# Patient Record
Sex: Female | Born: 1970 | Hispanic: Yes | Marital: Married | Smoking: Never smoker
Health system: Southern US, Community
[De-identification: ages and names within clinical notes are randomized; demographics above are authoritative.]

## PROBLEM LIST (undated history)

## (undated) DIAGNOSIS — F988 Other specified behavioral and emotional disorders with onset usually occurring in childhood and adolescence: Secondary | ICD-10-CM

## (undated) DIAGNOSIS — K219 Gastro-esophageal reflux disease without esophagitis: Secondary | ICD-10-CM

## (undated) HISTORY — DX: Gastro-esophageal reflux disease without esophagitis: K21.9

## (undated) HISTORY — DX: Other specified behavioral and emotional disorders with onset usually occurring in childhood and adolescence: F98.8

## (undated) HISTORY — PX: BREAST SURGERY: SHX581

---

## 2002-03-21 ENCOUNTER — Emergency Department (HOSPITAL_COMMUNITY): Admission: EM | Admit: 2002-03-21 | Discharge: 2002-03-21 | Payer: Self-pay | Admitting: Emergency Medicine

## 2002-11-24 ENCOUNTER — Emergency Department (HOSPITAL_COMMUNITY): Admission: EM | Admit: 2002-11-24 | Discharge: 2002-11-24 | Payer: Self-pay | Admitting: Emergency Medicine

## 2003-02-09 ENCOUNTER — Emergency Department (HOSPITAL_COMMUNITY): Admission: EM | Admit: 2003-02-09 | Discharge: 2003-02-09 | Payer: Self-pay | Admitting: Emergency Medicine

## 2003-02-12 ENCOUNTER — Emergency Department (HOSPITAL_COMMUNITY): Admission: EM | Admit: 2003-02-12 | Discharge: 2003-02-12 | Payer: Self-pay | Admitting: Emergency Medicine

## 2003-12-28 ENCOUNTER — Other Ambulatory Visit: Admission: RE | Admit: 2003-12-28 | Discharge: 2003-12-28 | Payer: Self-pay | Admitting: Family Medicine

## 2004-03-02 ENCOUNTER — Emergency Department (HOSPITAL_COMMUNITY): Admission: EM | Admit: 2004-03-02 | Discharge: 2004-03-02 | Payer: Self-pay | Admitting: Emergency Medicine

## 2004-12-29 ENCOUNTER — Other Ambulatory Visit: Admission: RE | Admit: 2004-12-29 | Discharge: 2004-12-29 | Payer: Self-pay | Admitting: Family Medicine

## 2006-02-02 ENCOUNTER — Other Ambulatory Visit: Admission: RE | Admit: 2006-02-02 | Discharge: 2006-02-02 | Payer: Self-pay | Admitting: Family Medicine

## 2010-02-18 ENCOUNTER — Other Ambulatory Visit (HOSPITAL_COMMUNITY)
Admission: RE | Admit: 2010-02-18 | Discharge: 2010-02-18 | Disposition: A | Discharge status: Home / Self Care | Payer: Medicaid Other | Source: Ambulatory Visit | Attending: Family Medicine | Admitting: Family Medicine

## 2010-02-18 ENCOUNTER — Other Ambulatory Visit: Payer: Self-pay | Admitting: Family Medicine

## 2010-02-18 DIAGNOSIS — Z1159 Encounter for screening for other viral diseases: Secondary | ICD-10-CM | POA: Insufficient documentation

## 2010-02-18 DIAGNOSIS — Z124 Encounter for screening for malignant neoplasm of cervix: Secondary | ICD-10-CM | POA: Insufficient documentation

## 2010-02-18 DIAGNOSIS — Z113 Encounter for screening for infections with a predominantly sexual mode of transmission: Secondary | ICD-10-CM | POA: Insufficient documentation

## 2010-02-21 ENCOUNTER — Other Ambulatory Visit: Payer: Self-pay | Admitting: Family Medicine

## 2010-02-21 DIAGNOSIS — Z1231 Encounter for screening mammogram for malignant neoplasm of breast: Secondary | ICD-10-CM

## 2010-03-04 ENCOUNTER — Ambulatory Visit
Admission: RE | Admit: 2010-03-04 | Discharge: 2010-03-04 | Disposition: A | Discharge status: Home / Self Care | Payer: Medicaid Other | Source: Ambulatory Visit | Attending: Family Medicine | Admitting: Family Medicine

## 2010-03-04 DIAGNOSIS — Z1231 Encounter for screening mammogram for malignant neoplasm of breast: Secondary | ICD-10-CM

## 2010-03-08 ENCOUNTER — Other Ambulatory Visit: Payer: Self-pay | Admitting: Family Medicine

## 2010-03-08 DIAGNOSIS — R928 Other abnormal and inconclusive findings on diagnostic imaging of breast: Secondary | ICD-10-CM

## 2010-03-14 ENCOUNTER — Ambulatory Visit
Admission: RE | Admit: 2010-03-14 | Discharge: 2010-03-14 | Disposition: A | Discharge status: Home / Self Care | Payer: Medicaid Other | Source: Ambulatory Visit | Attending: Family Medicine | Admitting: Family Medicine

## 2010-03-14 DIAGNOSIS — R928 Other abnormal and inconclusive findings on diagnostic imaging of breast: Secondary | ICD-10-CM

## 2011-07-10 ENCOUNTER — Encounter: Payer: Self-pay | Admitting: *Deleted

## 2011-07-10 ENCOUNTER — Emergency Department
Admission: EM | Admit: 2011-07-10 | Discharge: 2011-07-10 | Disposition: A | Discharge status: Home / Self Care | Payer: Medicaid Other | Source: Home / Self Care

## 2011-07-10 DIAGNOSIS — N390 Urinary tract infection, site not specified: Secondary | ICD-10-CM

## 2011-07-10 DIAGNOSIS — R300 Dysuria: Secondary | ICD-10-CM

## 2011-07-10 LAB — POCT URINALYSIS DIP (MANUAL ENTRY)
Glucose, UA: NEGATIVE
Nitrite, UA: NEGATIVE
Spec Grav, UA: 1.025 (ref 1.005–1.03)
Urobilinogen, UA: 0.2 (ref 0–1)

## 2011-07-10 MED ORDER — CEPHALEXIN 500 MG PO CAPS: 500.0000 mg | ORAL_CAPSULE | Freq: Three times a day (TID) | ORAL | Status: AC

## 2011-07-10 NOTE — Discharge Instructions (Signed)

## 2011-07-10 NOTE — ED Provider Notes (Signed)
History     CSN: [id number]  Arrival date & time 07/10/11  1205   First MD Initiated Contact with Patient 07/10/11 1216      No chief complaint on file. HPI Comments: DYSURIA Onset:  2 days  Description: dysuria, increased urinary frequency  Modifying factors: none   Symptoms Urgency:  yes Frequency: yes  Hesitancy:  no Hematuria:  mild Flank Pain:  mild Fever: no Nausea/Vomiting:  no Missed LMP: no STD exposure: no Discharge: no Irritants: no Rash: no  Red Flags   More than 3 UTI's last 12 months:  no PMH of  Diabetes or Immunosuppression:  no Renal Disease/Calculi: no Urinary Tract Abnormality:  no Instrumentation or Trauma: no      No past medical history on file.  No past surgical history on file.  No family history on file.  History  Substance Use Topics  . Smoking status: Not on file  . Smokeless tobacco: Not on file  . Alcohol Use: Not on file    OB History    No data available      Review of Systems  All other systems reviewed and are negative.    Allergies  Review of patient's allergies indicates not on file.  Home Medications  No current outpatient prescriptions on file.  There were no vitals taken for this visit.  Physical Exam  Constitutional: She appears well-developed and well-nourished.  HENT:  Head: Normocephalic and atraumatic.  Eyes: Conjunctivae are normal. Pupils are equal, round, and reactive to light.  Neck: Normal range of motion. Neck supple.  Cardiovascular: Normal rate and regular rhythm.   Pulmonary/Chest: Effort normal and breath sounds normal.  Abdominal: Soft.       + mild flank pain  + mild suprapubic pain   Musculoskeletal: Normal range of motion.  Neurological: She is alert.  Skin: Skin is warm.    ED Course  Procedures (including critical care time)  Labs Reviewed - No data to display No results found.   No diagnosis found.    MDM  Exam clinically consistent with UTI.  Will treat with  keflex Urine culture.  Infectious red flags discussed.  Handout given  Follow up as needed.     The patient and/or caregiver has been counseled thoroughly with regard to treatment plan and/or medications prescribed including dosage, schedule, interactions, rationale for use, and possible side effects and they verbalize understanding. Diagnoses and expected course of recovery discussed and will return if not improved as expected or if the condition worsens. Patient and/or caregiver verbalized understanding.             Floydene Flock, MD 07/10/11 [phone]

## 2011-07-10 NOTE — ED Notes (Signed)
Patient c/o dysuria, urinary frequency, flank pain and hematuria x this AM. She has not taken any OTC meds.

## 2011-07-12 NOTE — ED Provider Notes (Signed)
Pt seen by Dr. Doroteo Bradford, MD 07/12/11 [phone]

## 2011-07-13 ENCOUNTER — Telehealth: Payer: Self-pay | Admitting: *Deleted

## 2011-08-07 ENCOUNTER — Encounter: Payer: Self-pay | Admitting: Physician Assistant

## 2011-08-07 ENCOUNTER — Other Ambulatory Visit (HOSPITAL_COMMUNITY)
Admission: RE | Admit: 2011-08-07 | Discharge: 2011-08-07 | Disposition: A | Discharge status: Home / Self Care | Payer: BC Managed Care – PPO | Source: Ambulatory Visit | Attending: Family Medicine | Admitting: Family Medicine

## 2011-08-07 ENCOUNTER — Ambulatory Visit (INDEPENDENT_AMBULATORY_CARE_PROVIDER_SITE_OTHER): Payer: BC Managed Care – PPO | Admitting: Physician Assistant

## 2011-08-07 VITALS — BP 99/64 | HR 71 | Ht 61.0 in | Wt 113.0 lb

## 2011-08-07 DIAGNOSIS — F3281 Premenstrual dysphoric disorder: Secondary | ICD-10-CM

## 2011-08-07 DIAGNOSIS — Z131 Encounter for screening for diabetes mellitus: Secondary | ICD-10-CM

## 2011-08-07 DIAGNOSIS — Z01419 Encounter for gynecological examination (general) (routine) without abnormal findings: Secondary | ICD-10-CM | POA: Insufficient documentation

## 2011-08-07 DIAGNOSIS — Z13220 Encounter for screening for lipoid disorders: Secondary | ICD-10-CM

## 2011-08-07 DIAGNOSIS — N943 Premenstrual tension syndrome: Secondary | ICD-10-CM

## 2011-08-07 DIAGNOSIS — Z0000 Encounter for general adult medical examination without abnormal findings: Secondary | ICD-10-CM

## 2011-08-07 DIAGNOSIS — N898 Other specified noninflammatory disorders of vagina: Secondary | ICD-10-CM

## 2011-08-07 DIAGNOSIS — Z113 Encounter for screening for infections with a predominantly sexual mode of transmission: Secondary | ICD-10-CM | POA: Insufficient documentation

## 2011-08-07 NOTE — Patient Instructions (Addendum)
Can try B6 50 mg daily. Make sure getting Calcium 500mg  twice a day or 4 servings of daily. Exercise regularly. Try these things and see if they help your PMDD symptoms. If not we can consider medication. Will call with labs.   Premenstrual Syndrome Premenstrual syndrome (PMS) or premenstrual disphoric disorder (PMDD) is a mix of emotional and clinical symptoms. PMS occurs 10 to 14 days before the start of a menstrual period. Common symptoms include pelvic pain, headache and mood changes. Most women have PMS to some degree.  CAUSES  The cause is unknown. There is evidence that it is related to the female hormones during the second half of the menstrual cycle. These hormones fluctuate and are thought to affect chemicals in the brain (serotonin) that can influence a person's mood. SYMPTOMS  Symptoms may include any of the following:  Headache.   Swelling of hands and feet.   Abdominal bloating.   Tiredness.   Breast tenderness.   Depression.   Crying spells.   Anxiety.   Irritability.   Confusion.   Joint and muscle pains.   Forgetfulness.   Withdrawal from family, friends and activities.  DIAGNOSIS  Diagnosis is made by your caregiver who will ask you questions about the kind of symptoms you are having, when they occur and what may bring them on. If your are having any of the symptoms listed above that occur 10 to 14 days before your menstrual period, it is strong evidence you have PMS. TREATMENT   Only take over-the-counter or prescription medicines for pain, discomfort or fever as directed by your caregiver.   Oral contraceptives.   Hormone therapy.   Medications that slow down the production of serotonin in the brain (fluoxetine, sertraline and others).   Diuretics. These get rid of extra fluid from your body.   Anti-depression medication when necessary.   Surgery to remove both ovaries. This is a last resort and if no further pregnancies are wanted.   Consider  counseling or joining a PMS therapy support group.  HOME CARE INSTRUCTIONS   Exercise regularly as suggested by your caregiver. Exercise especially before your menstrual period.   Eat a regular, well-balanced diet rich in carbohydrates.   Restrict or eliminate caffeine, alcohol and tobacco consumption.   Be sure to get enough sleep. Practice relaxation techniques.   Drink 64 oz. fluids per day. This is 8 glasses, 8 oz. each. It is best to drink water.   Eliminate known stressors in your life.   Attend relationship or parenting counseling, if needed.   Take a multi-vitamin in the recommended daily dosages.   Calcium, magnesium, vitamin B6 and vitamin E are some times helpful for PMS symptoms.   Take medications as suggested by your caregiver.  SEEK MEDICAL CARE IF:   You need medication for excessive swelling, depression, severe headaches, or because you cannot sleep.   You need help from your caregiver to help you decide if you need to have your ovaries removed because none of your treatment is helping you.  Document Released: 12/24/1999 Document Revised: 12/15/2010 Document Reviewed: 03/25/2008 [facility] Patient Information 2012 [place], [place].

## 2011-08-07 NOTE — Addendum Note (Signed)
Addended by: Ellsworth Lennox on: 08/07/2011 04:42 PM   Modules accepted: Orders

## 2011-08-07 NOTE — Progress Notes (Signed)
  Subjective:     Carol Silva is a 41 y.o. female and is here for a comprehensive physical exam. The patient reports problems - around her period she gets more depressed and sad. She is fine other 2 weeks and feels great. She does not want to go on medication. she has recently started exercising regularly. She tried to eat healthy. She does consume caffeine but tries to limit it. She does not drink. .  History   Social History  . Marital Status: Legally Separated    Spouse Name: N/A    Number of Children: N/A  . Years of Education: N/A   Occupational History  . Not on file.   Social History Main Topics  . Smoking status: Never Smoker   . Smokeless tobacco: Not on file  . Alcohol Use: No  . Drug Use: No  . Sexually Active: Yes   Other Topics Concern  . Not on file   Social History Narrative  . No narrative on file   Health Maintenance  Topic Date Due  . Influenza Vaccine  10/10/2011  . Pap Smear  02/18/2013  . Tetanus/tdap  08/07/2018    The following portions of the patient's history were reviewed and updated as appropriate: allergies, current medications, past family history, past medical history, past social history, past surgical history and problem list.  Review of Systems A comprehensive review of systems was negative.   Objective:    BP 99/64  Pulse 71  Ht 5\' 1"  (1.549 m)  Wt 113 lb (51.256 kg)  BMI 21.35 kg/m2  SpO2 99%  LMP 07/31/2011 General appearance: alert, cooperative and appears stated age Head: Normocephalic, without obvious abnormality, atraumatic Eyes: conjunctivae/corneas clear. PERRL, EOM's intact. Fundi benign. Ears: normal TM's and external ear canals both ears Nose: Nares normal. Septum midline. Mucosa normal. No drainage or sinus tenderness. Throat: lips, mucosa, and tongue normal; teeth and gums normal Neck: no adenopathy, no carotid bruit, no JVD, supple, symmetrical, trachea midline and thyroid not enlarged, symmetric, no  tenderness/mass/nodules Back: symmetric, no curvature. ROM normal. No CVA tenderness. Lungs: clear to auscultation bilaterally Breasts: normal appearance, no masses or tenderness Bilateral implants Heart: regular rate and rhythm, S1, S2 normal, no murmur, click, rub or gallop Abdomen: soft, non-tender; bowel sounds normal; no masses,  no organomegaly Pelvic: cervix normal in appearance, external genitalia normal, no adnexal masses or tenderness, no cervical motion tenderness, uterus normal size, shape, and consistency and thin white discharge with no odor present around cervix Extremities: extremities normal, atraumatic, no cyanosis or edema Pulses: 2+ and symmetric Skin: Skin color, texture, turgor normal. No rashes or lesions Lymph nodes: Cervical, supraclavicular, and axillary nodes normal. Neurologic: Grossly normal    Assessment:    Healthy female exam.      Plan:    CPE/PMDD/Vaginal discharge-Will add wet prep to pap and call with results. Gave patient handout on PMDD and symptomatic treatment. Encourage her to start B6 and calcium daily. Calcium at least 4 serviing of dairy daily or 500mg  twice a day. Encouraged patient to exercise regularly and maintain a healthy diet. If prementrual symptoms are not improving we can consider medications. Will get fasting labs and call with results. Vaccines up to date.  See After Visit Summary for Counseling Recommendations

## 2011-08-16 LAB — LIPID PANEL
HDL: 56 mg/dL (ref >39)
LDL Cholesterol: 102 mg/dL — ABNORMAL HIGH (ref 0–99)
Total CHOL/HDL Ratio: 3.2 Ratio
Triglycerides: 93 mg/dL (ref <150)

## 2011-08-16 LAB — COMPREHENSIVE METABOLIC PANEL
ALT: 13 U/L (ref 0–35)
Alkaline Phosphatase: 75 U/L (ref 39–117)
Creat: 0.63 mg/dL (ref 0.50–1.10)
Glucose, Bld: 79 mg/dL (ref 70–99)
Sodium: 139 mEq/L (ref 135–145)
Total Bilirubin: 0.6 mg/dL (ref 0.3–1.2)
Total Protein: 7 g/dL (ref 6.0–8.3)

## 2011-08-17 LAB — VITAMIN D 25 HYDROXY (VIT D DEFICIENCY, FRACTURES): Vit D, 25-Hydroxy: 35 ng/mL (ref 30–89)

## 2011-09-01 ENCOUNTER — Encounter: Payer: Self-pay | Admitting: Physician Assistant

## 2011-09-01 DIAGNOSIS — F988 Other specified behavioral and emotional disorders with onset usually occurring in childhood and adolescence: Secondary | ICD-10-CM | POA: Insufficient documentation

## 2011-09-01 DIAGNOSIS — K219 Gastro-esophageal reflux disease without esophagitis: Secondary | ICD-10-CM

## 2011-09-01 HISTORY — DX: Gastro-esophageal reflux disease without esophagitis: K21.9

## 2011-09-01 HISTORY — DX: Other specified behavioral and emotional disorders with onset usually occurring in childhood and adolescence: F98.8

## 2012-03-19 ENCOUNTER — Encounter: Payer: Self-pay | Admitting: *Deleted

## 2012-03-19 ENCOUNTER — Emergency Department
Admission: EM | Admit: 2012-03-19 | Discharge: 2012-03-19 | Disposition: A | Discharge status: Home / Self Care | Payer: BC Managed Care – PPO | Source: Home / Self Care | Attending: Family Medicine | Admitting: Family Medicine

## 2012-03-19 DIAGNOSIS — R21 Rash and other nonspecific skin eruption: Secondary | ICD-10-CM

## 2012-03-19 MED ORDER — CLOTRIMAZOLE-BETAMETHASONE 1-0.05 % EX CREA: TOPICAL_CREAM | Freq: Two times a day (BID) | CUTANEOUS | Status: DC

## 2012-03-19 MED ORDER — MUPIROCIN CALCIUM 2 % EX CREA: TOPICAL_CREAM | Freq: Three times a day (TID) | CUTANEOUS | Status: DC

## 2012-03-19 NOTE — ED Provider Notes (Signed)
History     CSN: [id number]  Arrival date & time 03/19/12  1854   First MD Initiated Contact with Patient 03/19/12 1910      Chief Complaint  Patient presents with  . Rash      HPI Comments: Patient developed a small pruritic spot on her right shoulder about a week ago, and noticed a small rash at that location.  There rash and itching have persisted.  No lesions elsewhere.  She feels well otherwise.  She recalls no insect bites.  No tick bites.  Patient is a 42 y.o. female presenting with rash. The history is provided by the patient.  Rash Location:  Shoulder/arm Shoulder/arm rash location:  R shoulder Quality: itchiness and redness   Quality: not blistering, not painful, not swelling and not weeping   Severity:  Mild Onset quality:  Sudden Duration:  1 week Timing:  Constant Progression:  Worsening Chronicity:  New Context comment:  Unknown cause Relieved by:  Nothing Ineffective treatments:  Antibiotic cream Associated symptoms: no fatigue, no fever, no induration, no sore throat and no URI     History reviewed. No pertinent past medical history.  Past Surgical History  Procedure Laterality Date  . Cesarean section      Family History  Problem Relation Age of Onset  . Diabetes Mother   . Hyperlipidemia Father   . Stroke Paternal Grandfather   . Diabetes Paternal Grandfather     History  Substance Use Topics  . Smoking status: Never Smoker   . Smokeless tobacco: Not on file  . Alcohol Use: No    OB History   Grav Para Term Preterm Abortions TAB SAB Ect Mult Living                  Review of Systems  Constitutional: Negative for fever and fatigue.  HENT: Negative for sore throat.   Skin: Positive for rash.  All other systems reviewed and are negative.    Allergies  Review of patient's allergies indicates no known allergies.  Home Medications   Current Outpatient Rx  Name  Route  Sig  Dispense  Refill  . clotrimazole-betamethasone  (LOTRISONE) cream   Topical   Apply topically 2 (two) times daily.   15 g   0   . mupirocin cream (BACTROBAN) 2 %   Topical   Apply topically 3 (three) times daily.   15 g   0     BP 95/60  Pulse 63  Temp(Src) 98.3 F (36.8 C) (Oral)  Wt 112 lb (50.803 kg)  BMI 21.17 kg/m2  SpO2 99%  Physical Exam  Constitutional: She is oriented to person, place, and time. She appears well-developed and well-nourished. No distress.  HENT:  Head: Normocephalic.  Nose: Nose normal.  Mouth/Throat: Oropharynx is clear and moist.  Eyes: Conjunctivae are normal. Pupils are equal, round, and reactive to light.  Neck:    Over the right anterior shoulder is a small macular faint patch of erythema about 8mm dia.  There appears to be barely visible annular erythema around the central lesion as noted.  Lesion is not suggestive of a target lesion.  Cardiovascular: Normal heart sounds.   Pulmonary/Chest: Breath sounds normal.  Lymphadenopathy:    She has no cervical adenopathy.  Neurological: She is alert and oriented to person, place, and time.  Skin: Skin is warm and dry. Rash noted.    ED Course  Procedures none      1. Rash and nonspecific  skin eruption; lesion is suggestive of tinea corporis      MDM  Empirically begin Lotrisone cream and Bactroban cream. Followup with dermatologist if not improving one week        Lattie Haw, MD 03/20/12 1044

## 2012-03-19 NOTE — ED Notes (Signed)
Pt c/o rash/lesion on her RT shoulder x 1wk. Denies fever. She has applied antibiotic oint with no relief.

## 2012-03-19 NOTE — Discharge Instructions (Signed)
Rash  A rash is a change in the color or texture of your skin. There are many different types of rashes. You may have other problems that accompany your rash.  CAUSES     Infections.   Allergic reactions. This can include allergies to pets or foods.   Certain medicines.   Exposure to certain chemicals, soaps, or cosmetics.   Heat.   Exposure to poisonous plants.   Tumors, both cancerous and noncancerous.  SYMPTOMS     Redness.   Scaly skin.   Itchy skin.   Dry or cracked skin.   Bumps.   Blisters.   Pain.  DIAGNOSIS   Your caregiver may do a physical exam to determine what type of rash you have. A skin sample (biopsy) may be taken and examined under a microscope.  TREATMENT    Treatment depends on the type of rash you have. Your caregiver may prescribe certain medicines. For serious conditions, you may need to see a skin doctor (dermatologist).  HOME CARE INSTRUCTIONS     Avoid the substance that caused your rash.   Do not scratch your rash. This can cause infection.   You may take cool baths to help stop itching.   Only take over-the-counter or prescription medicines as directed by your caregiver.   Keep all follow-up appointments as directed by your caregiver.  SEEK IMMEDIATE MEDICAL CARE IF:   You have increasing pain, swelling, or redness.   You have a fever.   You have new or severe symptoms.   You have body aches, diarrhea, or vomiting.   Your rash is not better after 3 days.  MAKE SURE YOU:   Understand these instructions.   Will watch your condition.   Will get help right away if you are not doing well or get worse.  Document Released: 12/16/2001 Document Revised: 03/20/2011 Document Reviewed: 10/10/2010  [employer] Patient Information 2013 [employer].

## 2012-03-21 ENCOUNTER — Telehealth: Payer: Self-pay | Admitting: Emergency Medicine

## 2012-04-08 ENCOUNTER — Ambulatory Visit (INDEPENDENT_AMBULATORY_CARE_PROVIDER_SITE_OTHER): Payer: BC Managed Care – PPO | Admitting: Family Medicine

## 2012-04-08 ENCOUNTER — Encounter: Payer: Self-pay | Admitting: Family Medicine

## 2012-04-08 VITALS — BP 99/63 | HR 60 | Temp 97.4°F | Wt 110.0 lb

## 2012-04-08 DIAGNOSIS — M542 Cervicalgia: Secondary | ICD-10-CM

## 2012-04-08 DIAGNOSIS — A499 Bacterial infection, unspecified: Secondary | ICD-10-CM

## 2012-04-08 DIAGNOSIS — J329 Chronic sinusitis, unspecified: Secondary | ICD-10-CM

## 2012-04-08 DIAGNOSIS — B9689 Other specified bacterial agents as the cause of diseases classified elsewhere: Secondary | ICD-10-CM

## 2012-04-08 MED ORDER — ORPHENADRINE CITRATE ER 100 MG PO TB12: 100.0000 mg | ORAL_TABLET | Freq: Two times a day (BID) | ORAL | Status: DC | PRN

## 2012-04-08 MED ORDER — AMOXICILLIN-POT CLAVULANATE 500-125 MG PO TABS: ORAL_TABLET | ORAL | Status: AC

## 2012-04-08 MED ORDER — MELOXICAM 15 MG PO TABS: 15.0000 mg | ORAL_TABLET | Freq: Every day | ORAL | Status: DC | PRN

## 2012-04-08 NOTE — Progress Notes (Signed)
CC: Carol Silva is a 42 y.o. female is here for Sinusitis   Subjective: HPI:  Patient complains of bilateral facial pain and nasal congestion has been present for over a week. It is moderate in severity, worse when leaning forward, pain described as a pressure sensation. Symptoms seem to be getting worse on a daily basis. No interventions as of yet. Accompanied by ear fullness and mild dizziness without ringing or hearing loss. Symptoms are present all hours of the day but did not interfere with sleep. Nothing else makes better or worse than above  Patient complains of chronic neck pain described as mild/moderate in severity and as a stiffness. It is worse when looking up. It is localized low in the neck in the midline and often radiates towards the right shoulder. When the pain is at its worst it is almost always accompanied by tingling and numbness in all 10 fingers. At one time it responded to Flexeril but not recently. She has never been to physical therapy. She has had a x-ray of her neck about 3 years ago with a former provider which did not show any abnormalities.  She denies coordination difficulty, recent or remote trauma, nor weakness in the upper extremities. Symptoms are presently daily basis, have been getting worse over the past few months.    Review Of Systems Outlined In HPI  Past Medical History  Diagnosis Date  . ADD (attention deficit disorder) 09/01/2011  . GERD (gastroesophageal reflux disease) 09/01/2011     Family History  Problem Relation Age of Onset  . Diabetes Mother   . Hyperlipidemia Father   . Stroke Paternal Grandfather   . Diabetes Paternal Grandfather      History  Substance Use Topics  . Smoking status: Never Smoker   . Smokeless tobacco: Not on file  . Alcohol Use: No     Objective: Filed Vitals:   04/08/12 1528  BP: 99/63  Pulse: 60  Temp: 97.4 F (36.3 C)    General: Alert and Oriented, No Acute Distress HEENT: Pupils equal, round,  reactive to light. Conjunctivae clear.  External ears unremarkable, canals clear with intact TMs with appropriate landmarks.  Middle ear appears open without effusion. Pink inferior turbinates.  Moist mucous membranes, pharynx without inflammation nor lesions.  Neck supple without palpable lymphadenopathy nor abnormal masses. Frontal sinus tenderness to percussion Lungs: Clear to auscultation bilaterally, no wheezing/ronchi/rales.  Comfortable work of breathing. Good air movement. Cardiac: Regular rate and rhythm. Normal S1/S2.  No murmurs, rubs, nor gallops.   Neuro: CN II-XII grossly intact, full strength/rom of all four extremities, C5/L4/S1 DTRs 2/4 bilaterally, gait normal, rapid alternating movements normal, decreased light touch sensation in all 10 fingertips Back: Midline C-spine tenderness at approximately C6 and C5. No paraspinal muscle tenderness in the C-spine Extremities: No peripheral edema.  Strong peripheral pulses. In the right shoulder Hawkins, crossarm, empty can all cause right shoulder pain however this is localized to the posterior deltoid Mental Status: No depression, anxiety, nor agitation. Skin: Warm and dry.  Assessment & Plan: Carol Silva was seen today for sinusitis.  Diagnoses and associated orders for this visit:  Neck pain - meloxicam (MOBIC) 15 MG tablet; Take 1 tablet (15 mg total) by mouth daily as needed for pain. - Discontinue: orphenadrine (NORFLEX) 100 MG tablet; Take 1 tablet (100 mg total) by mouth 2 (two) times daily as needed for muscle spasms (neck pain). - MR Cervical Spine Wo Contrast; Future - orphenadrine (NORFLEX) 100 MG tablet; Take  1 tablet (100 mg total) by mouth 2 (two) times daily as needed for muscle spasms (neck pain).  Bacterial sinusitis - amoxicillin-clavulanate (AUGMENTIN) 500-125 MG per tablet; Take one by mouth every 8 hours for ten total days.    Bacterial sinusitis: Start Augmentin, consider nasal saline washes Neck pain: Patient is  concerned about disc pathology causing nerve impingement and is specifically requesting MRI. Discussed that pain could be coming from neck and/or right shoulder pathology, I like her to consider formal physical therapy and the above prescriptions first. She would prefer an MRI first if insurance provides coverage  25 minutes spent face-to-face during visit today of which at least 50% was counseling or coordinating care regarding bacterial sinusitis and neck pain.    Return in about 4 weeks (around 05/06/2012) for After MRI.

## 2012-04-09 ENCOUNTER — Telehealth: Payer: Self-pay | Admitting: Family Medicine

## 2012-04-09 NOTE — Telephone Encounter (Signed)
Pt notified and transferred up front to schedule a consult with Dr. Karie Schwalbe

## 2012-04-09 NOTE — Telephone Encounter (Signed)
Carol Silva, Will you please let Carol Silva know that I spoke with [employer] today ([phone]) and was told that her MRI request would not be covered unless symptoms persisted beyond three weeks of what they call "physician supervised treatment".  My recommendation at this point would be to follow up with our sports medicine specialist Dr. Karie Schwalbe in order to go over a home exercise and medication plan that would be recognized as physician supervised treatment.

## 2012-04-11 ENCOUNTER — Encounter: Payer: Self-pay | Admitting: Sports Medicine

## 2012-04-11 ENCOUNTER — Ambulatory Visit (INDEPENDENT_AMBULATORY_CARE_PROVIDER_SITE_OTHER): Payer: BC Managed Care – PPO | Admitting: Sports Medicine

## 2012-04-11 VITALS — BP 93/57 | HR 64

## 2012-04-11 DIAGNOSIS — M5412 Radiculopathy, cervical region: Secondary | ICD-10-CM | POA: Insufficient documentation

## 2012-04-11 MED ORDER — TRAMADOL HCL 50 MG PO TABS: 50.0000 mg | ORAL_TABLET | Freq: Three times a day (TID) | ORAL | Status: DC | PRN

## 2012-04-11 MED ORDER — PREDNISONE 50 MG PO TABS: ORAL_TABLET | ORAL | Status: DC
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
## 2012-04-11 NOTE — Assessment & Plan Note (Addendum)
We will start conservatively, she will continue her Mobic. I'm adding tramadol, prednisone. Formal physical therapy(she does need after hours appointment so I [facility] return in 4 weeks and we can do an MRI if no better.

## 2012-06-25 ENCOUNTER — Encounter: Payer: Self-pay | Admitting: Sports Medicine

## 2012-06-25 ENCOUNTER — Ambulatory Visit (INDEPENDENT_AMBULATORY_CARE_PROVIDER_SITE_OTHER): Payer: BC Managed Care – PPO | Admitting: Sports Medicine

## 2012-06-25 VITALS — BP 93/59 | HR 63 | Wt 109.8 lb

## 2012-06-25 DIAGNOSIS — M5412 Radiculopathy, cervical region: Secondary | ICD-10-CM

## 2012-06-25 NOTE — Progress Notes (Signed)
  Subjective:    CC: Followup  HPI: Right cervical radiculitis: Has been to 4 weeks of formal physical therapy, NSAIDs, steroids, muscle relaxers, still has pain which he localizes in her neck radiating down the right arm, posterior aspect, predominantly into the fourth and fifth fingers. Pain is worse with flexion, she has had a slight improvement, but has plateaued. Pain is moderate.  Past medical history, Surgical history, Family history not pertinant except as noted below, Social history, Allergies, and medications have been entered into the medical record, reviewed, and no changes needed.   Review of Systems: No fevers, chills, night sweats, weight loss, chest pain, or shortness of breath.   Objective:    General: Well Developed, well nourished, and in no acute distress.  Neuro: Alert and oriented x3, extra-ocular muscles intact, sensation grossly intact.  HEENT: Normocephalic, atraumatic, pupils equal round reactive to light, neck supple, no masses, no lymphadenopathy, thyroid nonpalpable.  Skin: Warm and dry, no rashes. Cardiac: Regular rate and rhythm, no murmurs rubs or gallops, no lower extremity edema.  Respiratory: Clear to auscultation bilaterally. Not using accessory muscles, speaking in full sentences. Neck: Inspection unremarkable. No palpable stepoffs. Negative Spurling's maneuver. Full neck range of motion Grip strength and sensation normal in bilateral hands Strength good C4 to T1 distribution No sensory change to C4 to T1 Negative Hoffman sign bilaterally Reflexes normal Impression and Recommendations:

## 2012-06-25 NOTE — Assessment & Plan Note (Signed)
Persistent symptoms that are evolving to be right-sided C8. At this point we have failed at least 6 weeks of conservative measures including formal physical therapy, oral steroids, muscle relaxers, and NSAIDs. I'm going to proceed with MRI for interventional injection planning. I will see her back to go over MRI results.

## 2012-06-26 ENCOUNTER — Telehealth: Payer: Self-pay | Admitting: *Deleted

## 2012-06-26 NOTE — Telephone Encounter (Signed)
PA obtained for MRI cervical spine w/o contrast. Authorized order id# [id number]. R.Cunningham, CMA

## 2012-06-29 ENCOUNTER — Ambulatory Visit (HOSPITAL_BASED_OUTPATIENT_CLINIC_OR_DEPARTMENT_OTHER)
Admission: RE | Admit: 2012-06-29 | Discharge: 2012-06-29 | Disposition: A | Discharge status: Home / Self Care | Payer: BC Managed Care – PPO | Source: Ambulatory Visit | Attending: Sports Medicine | Admitting: Sports Medicine

## 2012-06-29 DIAGNOSIS — M5412 Radiculopathy, cervical region: Secondary | ICD-10-CM

## 2012-06-29 DIAGNOSIS — R209 Unspecified disturbances of skin sensation: Secondary | ICD-10-CM | POA: Insufficient documentation

## 2012-06-29 DIAGNOSIS — M542 Cervicalgia: Secondary | ICD-10-CM | POA: Insufficient documentation

## 2012-07-05 ENCOUNTER — Ambulatory Visit (INDEPENDENT_AMBULATORY_CARE_PROVIDER_SITE_OTHER): Payer: BC Managed Care – PPO | Admitting: Sports Medicine

## 2012-07-05 ENCOUNTER — Other Ambulatory Visit: Payer: Self-pay | Admitting: Sports Medicine

## 2012-07-05 ENCOUNTER — Encounter: Payer: Self-pay | Admitting: Sports Medicine

## 2012-07-05 ENCOUNTER — Ambulatory Visit (INDEPENDENT_AMBULATORY_CARE_PROVIDER_SITE_OTHER): Payer: BC Managed Care – PPO

## 2012-07-05 VITALS — BP 93/57 | HR 86 | Wt 110.0 lb

## 2012-07-05 DIAGNOSIS — M25561 Pain in right knee: Secondary | ICD-10-CM

## 2012-07-05 DIAGNOSIS — M25569 Pain in unspecified knee: Secondary | ICD-10-CM

## 2012-07-05 DIAGNOSIS — M5412 Radiculopathy, cervical region: Secondary | ICD-10-CM

## 2012-07-05 DIAGNOSIS — R52 Pain, unspecified: Secondary | ICD-10-CM

## 2012-07-05 MED ORDER — GABAPENTIN 300 MG PO CAPS: ORAL_CAPSULE | ORAL | Status: DC

## 2012-07-05 NOTE — Assessment & Plan Note (Signed)
Symptoms continue to represent a right-sided C8 radiculitis. At this point her cervical spine MRI is negative which does suggest more muscular cause of nerve impingement. I am going to start gabapentin 300 mg at bedtime with an up taper. I would like to see her back in approximately one month to see how things are going, if no better, certainly we should consider nerve conduction studies. Hold off on Norflex for now, continue tramadol as needed.

## 2012-07-05 NOTE — Assessment & Plan Note (Signed)
Certainly with medial joint line pain there is a suspicion of a arthritis/degenerative joint disease We will get an x-ray to find out.

## 2012-07-05 NOTE — Progress Notes (Signed)
  Subjective:    CC: Follow up  HPI: This is a very pleasant 42 year old female who comes in to followup persistent right-sided cervical radiculitis, predominately a C8 distribution. She went through formal physical therapy, NSAIDs, steroids, muscle relaxers, nothing helped, eventually we obtained an MRI of her cervical spine which was negative, and she returns today with persistent symptoms. Symptoms bother her predominately at night, and she also notes pain on the contralateral side over the deltoid.  Right knee pain: Present for a few months, no injuries, localized over the medial joint line. No swelling, does get occasional clicking and popping but no buckling or locking. Pain is moderate, persistent. No radiation.  Past medical history, Surgical history, Family history not pertinant except as noted below, Social history, Allergies, and medications have been entered into the medical record, reviewed, and no changes needed.   Review of Systems: No fevers, chills, night sweats, weight loss, chest pain, or shortness of breath.   Objective:    General: Well Developed, well nourished, and in no acute distress.  Neuro: Alert and oriented x3, extra-ocular muscles intact, sensation grossly intact.  HEENT: Normocephalic, atraumatic, pupils equal round reactive to light, neck supple, no masses, no lymphadenopathy, thyroid nonpalpable.  Skin: Warm and dry, no rashes. Cardiac: Regular rate and rhythm, no murmurs rubs or gallops, no lower extremity edema.  Respiratory: Clear to auscultation bilaterally. Not using accessory muscles, speaking in full sentences. Neck: Inspection unremarkable. No palpable stepoffs. Negative Spurling's maneuver. Full neck range of motion Grip strength and sensation normal in bilateral hands Strength good C4 to T1 distribution No sensory change to C4 to T1 Negative Hoffman sign bilaterally Reflexes normal Minimal tenderness to palpation in the right cubital  tunnel. Right Knee: Normal to inspection with no erythema or effusion or obvious bony abnormalities. Tender to palpation at the medial joint line. ROM full in flexion and extension and lower leg rotation. Ligaments with solid consistent endpoints including ACL, PCL, LCL, MCL. Negative Mcmurray's, Apley's, and Thessalonian tests. Non painful patellar compression. Patellar glide without crepitus. Patellar and quadriceps tendons unremarkable. Hamstring and quadriceps strength is normal.   X-rays show mild medial compartment DJD.  Impression and Recommendations:

## 2012-08-05 ENCOUNTER — Ambulatory Visit: Payer: BC Managed Care – PPO | Admitting: Sports Medicine

## 2012-08-12 ENCOUNTER — Ambulatory Visit (INDEPENDENT_AMBULATORY_CARE_PROVIDER_SITE_OTHER): Payer: BC Managed Care – PPO | Admitting: Sports Medicine

## 2012-08-12 ENCOUNTER — Encounter: Payer: Self-pay | Admitting: Sports Medicine

## 2012-08-12 VITALS — BP 97/63 | HR 78 | Wt 108.0 lb

## 2012-08-12 DIAGNOSIS — M25561 Pain in right knee: Secondary | ICD-10-CM

## 2012-08-12 DIAGNOSIS — M5412 Radiculopathy, cervical region: Secondary | ICD-10-CM

## 2012-08-12 DIAGNOSIS — M25569 Pain in unspecified knee: Secondary | ICD-10-CM

## 2012-08-12 NOTE — Assessment & Plan Note (Signed)
Osteoarthritis on x-rays, she does also have a positive McMurray's. She does not want to pursue interventional treatment yet. Going to have her do the Mobic for the knee pain, home exercises, and build her some custom orthotics. She will return for custom orthotics.

## 2012-08-12 NOTE — Assessment & Plan Note (Signed)
Resolved with gabapentin at bedtime. Home exercises. Return as needed.

## 2012-08-12 NOTE — Progress Notes (Signed)
  Subjective:    CC: Followup  HPI: Right cervical radiculitis: C-spine MRI was negative, this suggests that the nerve is pinched either by the muscle, or at the elbow. I placed her on gabapentin at bedtime at the last visit and she returns essentially pain-free. She cannot take this during the daytime, it makes her too drowsy. Symptoms are mild, improving.  Right knee pain: X-rays showed mild osteoarthritis, she denies any mechanical symptoms, and at the medial joint line, moderate, persistent. No radiation.  Bilateral foot pain: Diffuse, worse in the mornings.  Past medical history, Surgical history, Family history not pertinant except as noted below, Social history, Allergies, and medications have been entered into the medical record, reviewed, and no changes needed.   Review of Systems: No fevers, chills, night sweats, weight loss, chest pain, or shortness of breath.   Objective:    General: Well Developed, well nourished, and in no acute distress.  Neuro: Alert and oriented x3, extra-ocular muscles intact, sensation grossly intact.  HEENT: Normocephalic, atraumatic, pupils equal round reactive to light, neck supple, no masses, no lymphadenopathy, thyroid nonpalpable.  Skin: Warm and dry, no rashes. Cardiac: Regular rate and rhythm, no murmurs rubs or gallops, no lower extremity edema.  Respiratory: Clear to auscultation bilaterally. Not using accessory muscles, speaking in full sentences. Right Knee: Trace visible effusion. Tender to palpation at the medial joint line. ROM full in flexion and extension and lower leg rotation. Ligaments with solid consistent endpoints including ACL, PCL, LCL, MCL. Positive McMurray's test with a palpable pop felt medially. Non painful patellar compression. Patellar glide without crepitus. Patellar and quadriceps tendons unremarkable. Hamstring and quadriceps strength is normal.   Impression and Recommendations:

## 2012-08-19 ENCOUNTER — Encounter: Payer: Self-pay | Admitting: Sports Medicine

## 2012-08-19 ENCOUNTER — Ambulatory Visit (INDEPENDENT_AMBULATORY_CARE_PROVIDER_SITE_OTHER): Payer: BC Managed Care – PPO | Admitting: Sports Medicine

## 2012-08-19 VITALS — BP 97/58 | HR 65 | Wt 107.0 lb

## 2012-08-19 DIAGNOSIS — M25561 Pain in right knee: Secondary | ICD-10-CM

## 2012-08-19 DIAGNOSIS — M25569 Pain in unspecified knee: Secondary | ICD-10-CM

## 2012-08-19 NOTE — Progress Notes (Signed)
    Patient was fitted for a : standard, cushioned, semi-rigid orthotic. The orthotic was heated and afterward the patient stood on the orthotic blank positioned on the orthotic stand. The patient was positioned in subtalar neutral position and 10 degrees of ankle dorsiflexion in a weight bearing stance. After completion of molding, a stable base was applied to the orthotic blank. The blank was ground to a stable position for weight bearing. Size:6 Base: Blue EVA Additional Posting and Padding: None The patient ambulated these, and they were very comfortable.  I spent 40 minutes with this patient, greater than 50% was face-to-face time counseling regarding the below diagnosis.   

## 2012-08-19 NOTE — Assessment & Plan Note (Signed)
This pleasant young lady has right knee osteoarthritis, orthotics were made, they did not fit in her little shoes. She will try the orthotics in her athletic shoes. I placed the scaphoid pads into her flats. Return in one month, consider injection if no better.  She will discuss unintentional weight loss with her primary care provider.

## 2012-09-16 ENCOUNTER — Ambulatory Visit (INDEPENDENT_AMBULATORY_CARE_PROVIDER_SITE_OTHER): Payer: BC Managed Care – PPO | Admitting: Physician Assistant

## 2012-09-16 ENCOUNTER — Ambulatory Visit (INDEPENDENT_AMBULATORY_CARE_PROVIDER_SITE_OTHER): Payer: BC Managed Care – PPO | Admitting: Sports Medicine

## 2012-09-16 ENCOUNTER — Encounter: Payer: Self-pay | Admitting: Physician Assistant

## 2012-09-16 ENCOUNTER — Encounter: Payer: Self-pay | Admitting: Sports Medicine

## 2012-09-16 VITALS — BP 97/60 | HR 62 | Wt 109.0 lb

## 2012-09-16 DIAGNOSIS — M25561 Pain in right knee: Secondary | ICD-10-CM

## 2012-09-16 DIAGNOSIS — M25569 Pain in unspecified knee: Secondary | ICD-10-CM

## 2012-09-16 DIAGNOSIS — Z1239 Encounter for other screening for malignant neoplasm of breast: Secondary | ICD-10-CM

## 2012-09-16 DIAGNOSIS — H938X3 Other specified disorders of ear, bilateral: Secondary | ICD-10-CM

## 2012-09-16 DIAGNOSIS — Z0000 Encounter for general adult medical examination without abnormal findings: Secondary | ICD-10-CM

## 2012-09-16 DIAGNOSIS — Z131 Encounter for screening for diabetes mellitus: Secondary | ICD-10-CM

## 2012-09-16 DIAGNOSIS — R5383 Other fatigue: Secondary | ICD-10-CM

## 2012-09-16 DIAGNOSIS — R0981 Nasal congestion: Secondary | ICD-10-CM

## 2012-09-16 DIAGNOSIS — Z13220 Encounter for screening for lipoid disorders: Secondary | ICD-10-CM

## 2012-09-16 MED ORDER — FLUTICASONE PROPIONATE 50 MCG/ACT NA SUSP: 2.0000 | Freq: Every day | NASAL | Status: DC

## 2012-09-16 NOTE — Progress Notes (Signed)
  Subjective:    CC: Followup  HPI: Right knee pain: This pleasant young lady has now had oral analgesics, home exercises, cost orthotics, unfortunately she continues to have right knee pain. Her x-rays in the past surprisingly showed osteoarthritis despite her thin habitus. She returns today for followup, pain is mild, persistent, worse when squatting, and bending. No radiation.  Past medical history, Surgical history, Family history not pertinant except as noted below, Social history, Allergies, and medications have been entered into the medical record, reviewed, and no changes needed.   Review of Systems: No fevers, chills, night sweats, weight loss, chest pain, or shortness of breath.   Objective:    General: Well Developed, well nourished, and in no acute distress.  Neuro: Alert and oriented x3, extra-ocular muscles intact, sensation grossly intact.  HEENT: Normocephalic, atraumatic, pupils equal round reactive to light, neck supple, no masses, no lymphadenopathy, thyroid nonpalpable.  Skin: Warm and dry, no rashes. Cardiac: Regular rate and rhythm, no murmurs rubs or gallops, no lower extremity edema.  Respiratory: Clear to auscultation bilaterally. Not using accessory muscles, speaking in full sentences.  Procedure: Real-time Ultrasound Guided Injection of right knee Device: GE Logiq E  Verbal informed consent obtained.  Time-out conducted.  Noted no overlying erythema, induration, or other signs of local infection.  Skin prepped in a sterile fashion.  Local anesthesia: Topical Ethyl chloride.  With sterile technique and under real time ultrasound guidance:  2 cc Kenalog 40, 4 cc lidocaine injected easily into the suprapatellar recess. Completed without difficulty  Pain immediately resolved suggesting accurate placement of the medication.  Advised to call if fevers/chills, erythema, induration, drainage, or persistent bleeding.  Images permanently stored and available for review  in the ultrasound unit.  Impression: Technically successful ultrasound guided injection.  Impression and Recommendations:

## 2012-09-16 NOTE — Patient Instructions (Addendum)
Keeping You Healthy  Will refer to have first mammogram.   Get These Tests 1. Blood Pressure- Have your blood pressure checked once a year by your health care provider.  Normal blood pressure is 120/80. 2. Weight- Have your body mass index (BMI) calculated to screen for obesity.  BMI is measure of body fat based on height and weight.  You can also calculate your own BMI at https://www.west-esparza.com/. 3. Cholesterol- Have your cholesterol checked every 5 years starting at age 40 then yearly starting at age 40. 4. Chlamydia, HIV, and other sexually transmitted diseases- Get screened every year until age 40, then within three months of each new sexual provider. 5. Pap Smear- Every 1-3 years; discuss with your health care provider. 6. Mammogram- Every year starting at age 40  Take these medicines  Calcium with Vitamin D-Your body needs 1200 mg of Calcium each day and [phone] IU of Vitamin D daily.  Your body can only absorb 500 mg of Calcium at a time so Calcium must be taken in 2 or 3 divided doses throughout the day.  Multivitamin with folic acid- Once daily if it is possible for you to become pregnant.  Get these Immunizations  Tetanus shot- Every 10 years.  Flu shot-Every year.  Take these steps 1. Do not smoke-Your healthcare provider can help you quit.  For tips on how to quit go to www.smokefree.gov or call 1-800 QUITNOW. 2. Be physically active- Exercise 5 days a week for at least 30 minutes.  If you are not already physically active, start slow and gradually work up to 30 minutes of moderate physical activity.  Examples of moderate activity include walking briskly, dancing, swimming, bicycling, etc. 3. Breast Cancer- A self breast exam every month is important for early detection of breast cancer.  For more information and instruction on self breast exams, ask your healthcare provider or [place]. 4. Eat a healthy diet- Eat a variety of healthy  foods such as fruits, vegetables, whole grains, low fat milk, low fat cheeses, yogurt, lean meats, poultry and fish, beans, nuts, tofu, etc.  For more information go to www. Thenutritionsource.org 5. Drink alcohol in moderation- Limit alcohol intake to one drink or less per day. Never drink and drive. 6. Depression- Your emotional health is as important as your physical health.  If you're feeling down or losing interest in things you normally enjoy please talk to your healthcare provider about being screened for depression. 7. Dental visit- Brush and floss your teeth twice daily; visit your dentist twice a year. 8. Eye doctor- Get an eye exam at least every 2 years. 9. Helmet use- Always wear a helmet when riding a bicycle, motorcycle, rollerblading or skateboarding. 10. Safe sex- If you may be exposed to sexually transmitted infections, use a condom. 11. Seat belts- Seat belts can save your live; always wear one. 12. Smoke/Carbon Monoxide detectors- These detectors need to be installed on the appropriate level of your home. Replace batteries at least once a year. 13. Skin cancer- When out in the sun please cover up and use sunscreen 15 SPF or higher. 14. Violence- If anyone is threatening or hurting you, please tell your healthcare provider.

## 2012-09-16 NOTE — Progress Notes (Signed)
  Subjective:     Carol Silva is a 42 y.o. female and is here for a comprehensive physical exam. The patient reports problems - she is a little concerned because she feels tired during the day. She reports she get 7 hours a sleep at night and has no problem falling asleep. She denies any depression but does feel anxious sometimes.  she has a lot going on in her life and is really busy.   She is also having some sinus pressure and ear congestion off and on for the past week. Denies any fever, chills, SOB, wheezing, n/v/d. Not tried anything. No hx of allergies.   History   Social History  . Marital Status: Divorced    Spouse Name: N/A    Number of Children: N/A  . Years of Education: N/A   Occupational History  . Not on file.   Social History Main Topics  . Smoking status: Never Smoker   . Smokeless tobacco: Not on file  . Alcohol Use: No  . Drug Use: No  . Sexual Activity: Yes   Other Topics Concern  . Not on file   Social History Narrative  . No narrative on file   Health Maintenance  Topic Date Due  . Influenza Vaccine  09/16/2013  . Pap Smear  08/07/2014  . Tetanus/tdap  08/07/2018    The following portions of the patient's history were reviewed and updated as appropriate: allergies, current medications, past family history, past medical history, past social history, past surgical history and problem list.  Review of Systems A comprehensive review of systems was negative.   Objective:    BP 97/60  Pulse 62  Wt 109 lb (49.442 kg)  BMI 20.61 kg/m2 General appearance: alert, cooperative and appears stated age Head: Normocephalic, without obvious abnormality, atraumatic Eyes: conjunctivae/corneas clear. PERRL, EOM's intact. Fundi benign. Ears: normal TM's and external ear canals both ears Nose: Nares normal. Septum midline. Mucosa normal. No drainage or sinus tenderness. Throat: lips, mucosa, and tongue normal; teeth and gums normal Neck: no adenopathy, no  carotid bruit, no JVD, supple, symmetrical, trachea midline and thyroid not enlarged, symmetric, no tenderness/mass/nodules Back: symmetric, no curvature. ROM normal. No CVA tenderness. Lungs: clear to auscultation bilaterally Heart: regular rate and rhythm, S1, S2 normal, no murmur, click, rub or gallop Abdomen: soft, non-tender; bowel sounds normal; no masses,  no organomegaly Extremities: extremities normal, atraumatic, no cyanosis or edema Pulses: 2+ and symmetric Skin: Skin color, texture, turgor normal. No rashes or lesions Lymph nodes: Cervical, supraclavicular, and axillary nodes normal. Neurologic: Grossly normal    Assessment:    Healthy female exam.      Plan:    CPE/fatigue- pap up to date. She does turn 42 in October will get pt set up for mammogram. Fasting lab slip given to pt. Recommended MVI plus calcium/vitamin D. Will get labs to check for any reason for fatigue. Encouraged pt to stay active. She is still in a good weight category but encourage her to make sure eating enough calories and protein.   Sinus congestion/ear congestion- Flonase sent to pharmacy to try. Discussed vitamin C and zinc when feels the start of a cold. May consider zyrtec if PND persists.   Pt declined flu shot.  See After Visit Summary for Counseling Recommendations

## 2012-09-16 NOTE — Assessment & Plan Note (Signed)
Right knee injection as above. Continue exercises, orthotics. Return to see me in one month.

## 2012-09-20 LAB — COMPLETE METABOLIC PANEL WITH GFR
AST: 17 U/L (ref 0–37)
Albumin: 4.2 g/dL (ref 3.5–5.2)
Alkaline Phosphatase: 61 U/L (ref 39–117)
BUN: 14 mg/dL (ref 6–23)
GFR, Est Non African American: >89 mL/min
Glucose, Bld: 94 mg/dL (ref 70–99)
Potassium: 4.1 mEq/L (ref 3.5–5.3)
Total Bilirubin: 0.5 mg/dL (ref 0.3–1.2)

## 2012-09-20 LAB — VITAMIN B12: Vitamin B-12: 1148 pg/mL — ABNORMAL HIGH (ref 211–911)

## 2012-09-20 LAB — LIPID PANEL
Cholesterol: 179 mg/dL (ref 0–200)
HDL: 59 mg/dL (ref >39)
Total CHOL/HDL Ratio: 3 Ratio
VLDL: 19 mg/dL (ref 0–40)

## 2012-09-20 LAB — CBC
Hemoglobin: 13.5 g/dL (ref 12.0–15.0)
Platelets: 276 10*3/uL (ref 150–400)
RBC: 4.25 MIL/uL (ref 3.87–5.11)
WBC: 7.6 10*3/uL (ref 4.0–10.5)

## 2012-09-20 LAB — TSH: TSH: 2.279 u[IU]/mL (ref 0.350–4.500)

## 2012-09-20 LAB — FERRITIN: Ferritin: 43 ng/mL (ref 10–291)

## 2012-09-21 LAB — VITAMIN D 25 HYDROXY (VIT D DEFICIENCY, FRACTURES): Vit D, 25-Hydroxy: 49 ng/mL (ref 30–89)

## 2012-10-18 ENCOUNTER — Ambulatory Visit: Payer: BC Managed Care – PPO | Admitting: Sports Medicine

## 2012-12-10 ENCOUNTER — Ambulatory Visit: Payer: BC Managed Care – PPO

## 2012-12-10 ENCOUNTER — Ambulatory Visit (INDEPENDENT_AMBULATORY_CARE_PROVIDER_SITE_OTHER): Payer: BC Managed Care – PPO | Admitting: Family Medicine

## 2012-12-10 ENCOUNTER — Encounter: Payer: Self-pay | Admitting: Family Medicine

## 2012-12-10 VITALS — BP 97/61 | HR 79 | Temp 98.2°F | Wt 110.0 lb

## 2012-12-10 DIAGNOSIS — Z1239 Encounter for other screening for malignant neoplasm of breast: Secondary | ICD-10-CM

## 2012-12-10 DIAGNOSIS — J029 Acute pharyngitis, unspecified: Secondary | ICD-10-CM

## 2012-12-10 DIAGNOSIS — B9689 Other specified bacterial agents as the cause of diseases classified elsewhere: Secondary | ICD-10-CM

## 2012-12-10 DIAGNOSIS — A499 Bacterial infection, unspecified: Secondary | ICD-10-CM

## 2012-12-10 DIAGNOSIS — J329 Chronic sinusitis, unspecified: Secondary | ICD-10-CM

## 2012-12-10 LAB — POCT RAPID STREP A (OFFICE): Rapid Strep A Screen: NEGATIVE

## 2012-12-10 MED ORDER — AMOXICILLIN-POT CLAVULANATE 500-125 MG PO TABS: ORAL_TABLET | ORAL | Status: DC

## 2012-12-10 NOTE — Progress Notes (Signed)
CC: Carol Silva is a 42 y.o. female is here for Sore Throat   Subjective: HPI:  Just slightly over one week of facial pressure localized between the eyes and in the forehead worse with leaning forward. Associated with fatigue, subjective postnasal drip nasal congestion and a sore throat. Symptoms are worse than a daily basis now moderate in severity. Symptoms are present all hours of the day not interfering with sleep. No improvement with Tylenol no other interventions. Denies fevers, chills, motor or sensory disturbances, vision loss, wheezing or shortness of breath.   Review Of Systems Outlined In HPI  Past Medical History  Diagnosis Date  . ADD (attention deficit disorder) 09/01/2011  . GERD (gastroesophageal reflux disease) 09/01/2011     Family History  Problem Relation Age of Onset  . Diabetes Mother   . Hyperlipidemia Father   . Stroke Paternal Grandfather   . Diabetes Paternal Grandfather      History  Substance Use Topics  . Smoking status: Never Smoker   . Smokeless tobacco: Not on file  . Alcohol Use: No     Objective: Filed Vitals:   12/10/12 1027  BP: 97/61  Pulse: 79  Temp: 98.2 F (36.8 C)    General: Alert and Oriented, No Acute Distress HEENT: Pupils equal, round, reactive to light. Conjunctivae clear.  External ears unremarkable, canals clear with intact TMs with appropriate landmarks.  Right middle ear with mild serous effusion left unremarkable an open. Pink inferior turbinates.  Moist mucous membranes, pharynx without inflammation nor lesions however moderate cobblestoning.  Neck supple without palpable lymphadenopathy nor abnormal masses. Lungs: Clear to auscultation bilaterally, no wheezing/ronchi/rales.  Comfortable work of breathing. Good air movement. Cardiac: Regular rate and rhythm. Normal S1/S2.  No murmurs, rubs, nor gallops.   Mental Status: No depression, anxiety, nor agitation. Skin: Warm and dry.  Assessment & Plan: Carol Silva was seen  today for sore throat.  Diagnoses and associated orders for this visit:  Acute pharyngitis - POCT rapid strep A  Bacterial sinusitis - amoxicillin-clavulanate (AUGMENTIN) 500-125 MG per tablet; Take one by mouth every 8 hours for ten total days.    Bacterial sinusitis with postnasal drip causing pharyngitis rapid strep negative. Start Augmentin consider Advil cold and sinus for symptom control  Return if symptoms worsen or fail to improve.

## 2012-12-12 ENCOUNTER — Emergency Department
Admission: EM | Admit: 2012-12-12 | Discharge: 2012-12-12 | Disposition: A | Discharge status: Home / Self Care | Payer: BC Managed Care – PPO | Source: Home / Self Care | Attending: Family Medicine | Admitting: Family Medicine

## 2012-12-12 ENCOUNTER — Other Ambulatory Visit: Payer: Self-pay | Admitting: Physician Assistant

## 2012-12-12 ENCOUNTER — Encounter: Payer: Self-pay | Admitting: Emergency Medicine

## 2012-12-12 DIAGNOSIS — J039 Acute tonsillitis, unspecified: Secondary | ICD-10-CM

## 2012-12-12 DIAGNOSIS — R928 Other abnormal and inconclusive findings on diagnostic imaging of breast: Secondary | ICD-10-CM

## 2012-12-12 DIAGNOSIS — J029 Acute pharyngitis, unspecified: Secondary | ICD-10-CM

## 2012-12-12 LAB — POCT CBC W AUTO DIFF (K'VILLE URGENT CARE)

## 2012-12-12 LAB — POCT RAPID STREP A (OFFICE): Rapid Strep A Screen: NEGATIVE

## 2012-12-12 MED ORDER — METHYLPREDNISOLONE SODIUM SUCC 125 MG IJ SOLR
125.0000 mg | Freq: Once | INTRAMUSCULAR | Status: AC
  Administered 2012-12-12: 125 mg via INTRAMUSCULAR

## 2012-12-12 MED ORDER — MELOXICAM 15 MG PO TABS: 15.0000 mg | ORAL_TABLET | Freq: Every day | ORAL | Status: DC

## 2012-12-12 MED ORDER — CLINDAMYCIN HCL 300 MG PO CAPS: 300.0000 mg | ORAL_CAPSULE | Freq: Three times a day (TID) | ORAL | Status: DC

## 2012-12-12 NOTE — ED Provider Notes (Addendum)
CSN: [id number]     Arrival date & time 12/12/12  0813 History   First MD Initiated Contact with Patient 12/12/12 [phone]     Chief Complaint  Patient presents with  . Sore Throat    HPI  URI Symptoms Onset: 5-6 days  Description: rhinorrhea, nasal congestion, cough, severe sore throat  Modifying factors:  Was seen by PCP for sxs earlier in the week. Dxd with bacterial sinusitis. Placed on augmentin. This has not helped sxs.   Symptoms Nasal discharge: yes Fever: no Sore throat: yes Cough: yes Wheezing: no Ear pain: no GI symptoms: no Sick contacts: no  Red Flags  Stiff neck: no Dyspnea: no Rash: no Swallowing difficulty: no  Sinusitis Risk Factors Headache/face pain: no Double sickening: no tooth pain: no  Allergy Risk Factors Sneezing: no Itchy scratchy throat: no Seasonal symptoms: no  Flu Risk Factors Headache: no muscle aches: no severe fatigue: no   Past Medical History  Diagnosis Date  . ADD (attention deficit disorder) 09/01/2011  . GERD (gastroesophageal reflux disease) 09/01/2011   Past Surgical History  Procedure Laterality Date  . Cesarean section     Family History  Problem Relation Age of Onset  . Diabetes Mother   . Hyperlipidemia Father   . Stroke Paternal Grandfather   . Diabetes Paternal Grandfather    History  Substance Use Topics  . Smoking status: Never Smoker   . Smokeless tobacco: Not on file  . Alcohol Use: Yes   OB History   Grav Para Term Preterm Abortions TAB SAB Ect Mult Living                 Review of Systems  All other systems reviewed and are negative.    Allergies  Review of patient's allergies indicates no known allergies.  Home Medications   Current Outpatient Rx  Name  Route  Sig  Dispense  Refill  . clindamycin (CLEOCIN) 300 MG capsule   Oral   Take 1 capsule (300 mg total) by mouth 3 (three) times daily.   30 capsule   0   . fluticasone (FLONASE) 50 MCG/ACT nasal spray   Nasal   Place 2  sprays into the nose daily.   16 g   2   . gabapentin (NEURONTIN) 300 MG capsule      One tab PO qHS for a week, then BID for a week, then TID.  May increase to a max of 4 tabs PO TID.   180 capsule   3   . meloxicam (MOBIC) 15 MG tablet   Oral   Take 1 tablet (15 mg total) by mouth daily.   30 tablet   1    BP 99/61  Pulse 79  Temp(Src) 97.8 F (36.6 C) (Oral)  Resp 16  Ht 5\' 1"  (1.549 m)  Wt 111 lb (50.349 kg)  BMI 20.98 kg/m2  SpO2 100% Physical Exam  Constitutional: She is oriented to person, place, and time. She appears well-developed and well-nourished.  HENT:  Head: Normocephalic and atraumatic.  Right Ear: External ear normal.  Left Ear: External ear normal.  Mouth/Throat: Oropharyngeal exudate (+ bilateral tonsillar exudate ) present.  +nasal erythema, rhinorrhea bilaterally, + post oropharyngeal erythema    Eyes: Conjunctivae are normal. Pupils are equal, round, and reactive to light.  Neck: Normal range of motion. Neck supple.  Cardiovascular: Normal rate and regular rhythm.   Pulmonary/Chest: Effort normal and breath sounds normal.  Abdominal: Soft.  Musculoskeletal: Normal range  of motion.  Lymphadenopathy:    She has no cervical adenopathy.  Neurological: She is alert and oriented to person, place, and time.  Skin: Skin is warm.    ED Course  Procedures (including critical care time) Labs Review Labs Reviewed  STREP A DNA PROBE  POCT RAPID STREP A (OFFICE)  POCT CBC W AUTO DIFF ([facility])  POCT MONO SCREEN ([facility])   Imaging Review   MDM   1. Acute pharyngitis   2. Tonsillitis    I suspect that overall sxs are likely viral in etiology Rapid strep and monospot negative today WBC today @ 7  No red flags on exam today like trismus, nuchal rigidity or drooling.  Pt does have a fair amount of tonsillar exudate with R sided cervical LAD Will transition pt to clindamycin for tonsillitis coverage. Solumedrol 125mg  IM x1.   Strep  culture.  Discussed ENT red flags at length with pt including those mentioned above.  Instructed with pt that if she is not better or clinical sxs worsen, she can follow up with ENT for reevaluation.  Local ENT clinical information given to pt.      The patient and/or caregiver has been counseled thoroughly with regard to treatment plan and/or medications prescribed including dosage, schedule, interactions, rationale for use, and possible side effects and they verbalize understanding. Diagnoses and expected course of recovery discussed and will return if not improved as expected or if the condition worsens. Patient and/or caregiver verbalized understanding.         Doree Albee, MD 12/12/12 9604  Doree Albee, MD 12/12/12 [phone]

## 2012-12-12 NOTE — ED Notes (Signed)
Pt c/o sore throat and adenopathy on the RT side of her neck x 5 days. Denies fever.

## 2012-12-13 LAB — STREP A DNA PROBE: GASP: NEGATIVE

## 2012-12-15 ENCOUNTER — Telehealth: Payer: Self-pay

## 2012-12-15 NOTE — ED Notes (Signed)
Left a message on voice mail asking how patient is feeling and advising to call back with any questions or concerns.  

## 2012-12-19 ENCOUNTER — Ambulatory Visit
Admission: RE | Admit: 2012-12-19 | Discharge: 2012-12-19 | Disposition: A | Discharge status: Home / Self Care | Payer: BC Managed Care – PPO | Source: Ambulatory Visit | Attending: Physician Assistant | Admitting: Physician Assistant

## 2012-12-19 ENCOUNTER — Encounter: Payer: Self-pay | Admitting: Physician Assistant

## 2012-12-19 DIAGNOSIS — R928 Other abnormal and inconclusive findings on diagnostic imaging of breast: Secondary | ICD-10-CM

## 2012-12-19 DIAGNOSIS — N6320 Unspecified lump in the left breast, unspecified quadrant: Secondary | ICD-10-CM | POA: Insufficient documentation

## 2013-01-17 ENCOUNTER — Encounter: Payer: Self-pay | Admitting: Physician Assistant

## 2013-01-17 ENCOUNTER — Ambulatory Visit (INDEPENDENT_AMBULATORY_CARE_PROVIDER_SITE_OTHER): Payer: BC Managed Care – PPO | Admitting: Physician Assistant

## 2013-01-17 VITALS — BP 97/58 | HR 79 | Wt 112.0 lb

## 2013-01-17 DIAGNOSIS — L708 Other acne: Secondary | ICD-10-CM

## 2013-01-17 DIAGNOSIS — N920 Excessive and frequent menstruation with regular cycle: Secondary | ICD-10-CM

## 2013-01-17 DIAGNOSIS — Z309 Encounter for contraceptive management, unspecified: Secondary | ICD-10-CM

## 2013-01-17 DIAGNOSIS — L709 Acne, unspecified: Secondary | ICD-10-CM

## 2013-01-17 MED ORDER — NORETHINDRONE-MESTRANOL 1-50 MG-MCG PO TABS: 1.0000 | ORAL_TABLET | Freq: Every day | ORAL | Status: DC

## 2013-01-17 NOTE — Patient Instructions (Addendum)
Contraception Choices Contraception (birth control) is the use of any methods or devices to prevent pregnancy. Below are some methods to help avoid pregnancy. HORMONAL METHODS   Contraceptive implant This is a thin, plastic tube containing progesterone hormone. It does not contain estrogen hormone. Your health care provider inserts the tube in the inner part of the upper arm. The tube can remain in place for up to 3 years. After 3 years, the implant must be removed. The implant prevents the ovaries from releasing an egg (ovulation), thickens the cervical mucus to prevent sperm from entering the uterus, and thins the lining of the inside of the uterus.  Progesterone-only injections These injections are given every 3 months by your health care provider to prevent pregnancy. This synthetic progesterone hormone stops the ovaries from releasing eggs. It also thickens cervical mucus and changes the uterine lining. This makes it harder for sperm to survive in the uterus.  Birth control pills These pills contain estrogen and progesterone hormone. They work by preventing the ovaries from releasing eggs (ovulation). They also cause the cervical mucus to thicken, preventing the sperm from entering the uterus. Birth control pills are prescribed by a health care provider.Birth control pills can also be used to treat heavy periods.  Minipill This type of birth control pill contains only the progesterone hormone. They are taken every day of each month and must be prescribed by your health care provider.  Birth control patch The patch contains hormones similar to those in birth control pills. It must be changed once a week and is prescribed by a health care provider.  Vaginal ring The ring contains hormones similar to those in birth control pills. It is left in the vagina for 3 weeks, removed for 1 week, and then a new one is put back in place. The patient must be comfortable inserting and removing the ring from the  vagina.A health care provider's prescription is necessary.  Emergency contraception Emergency contraceptives prevent pregnancy after unprotected sexual intercourse. This pill can be taken right after sex or up to 5 days after unprotected sex. It is most effective the sooner you take the pills after having sexual intercourse. Most emergency contraceptive pills are available without a prescription. Check with your pharmacist. Do not use emergency contraception as your only form of birth control. BARRIER METHODS   Female condom This is a thin sheath (latex or rubber) that is worn over the penis during sexual intercourse. It can be used with spermicide to increase effectiveness.  Female condom. This is a soft, loose-fitting sheath that is put into the vagina before sexual intercourse.  Diaphragm This is a soft, latex, dome-shaped barrier that must be fitted by a health care provider. It is inserted into the vagina, along with a spermicidal jelly. It is inserted before intercourse. The diaphragm should be left in the vagina for 6 to 8 hours after intercourse.  Cervical cap This is a round, soft, latex or plastic cup that fits over the cervix and must be fitted by a health care provider. The cap can be left in place for up to 48 hours after intercourse.  Sponge This is a soft, circular piece of polyurethane foam. The sponge has spermicide in it. It is inserted into the vagina after wetting it and before sexual intercourse.  Spermicides These are chemicals that kill or block sperm from entering the cervix and uterus. They come in the form of creams, jellies, suppositories, foam, or tablets. They do not require a   prescription. They are inserted into the vagina with an applicator before having sexual intercourse. The process must be repeated every time you have sexual intercourse. INTRAUTERINE CONTRACEPTION  Intrauterine device (IUD) This is a T-shaped device that is put in a woman's uterus during a  menstrual period to prevent pregnancy. There are 2 types:  Copper IUD This type of IUD is wrapped in copper wire and is placed inside the uterus. Copper makes the uterus and fallopian tubes produce a fluid that kills sperm. It can stay in place for 10 years.  Hormone IUD This type of IUD contains the hormone progestin (synthetic progesterone). The hormone thickens the cervical mucus and prevents sperm from entering the uterus, and it also thins the uterine lining to prevent implantation of a fertilized egg. The hormone can weaken or kill the sperm that get into the uterus. It can stay in place for 3 5 years, depending on which type of IUD is used. PERMANENT METHODS OF CONTRACEPTION  Female tubal ligation This is when the woman's fallopian tubes are surgically sealed, tied, or blocked to prevent the egg from traveling to the uterus.  Hysteroscopic sterilization This involves placing a small coil or insert into each fallopian tube. Your doctor uses a technique called hysteroscopy to do the procedure. The device causes scar tissue to form. This results in permanent blockage of the fallopian tubes, so the sperm cannot fertilize the egg. It takes about 3 months after the procedure for the tubes to become blocked. You must use another form of birth control for these 3 months.  Female sterilization This is when the female has the tubes that carry sperm tied off (vasectomy).This blocks sperm from entering the vagina during sexual intercourse. After the procedure, the man can still ejaculate fluid (semen). NATURAL PLANNING METHODS  Natural family planning This is not having sexual intercourse or using a barrier method (condom, diaphragm, cervical cap) on days the woman could become pregnant.  Calendar method This is keeping track of the length of each menstrual cycle and identifying when you are fertile.  Ovulation method This is avoiding sexual intercourse during ovulation.  Symptothermal method This is  avoiding sexual intercourse during ovulation, using a thermometer and ovulation symptoms.  Post ovulation method This is timing sexual intercourse after you have ovulated. Regardless of which type or method of contraception you choose, it is important that you use condoms to protect against the transmission of sexually transmitted infections (STIs). Talk with your health care provider about which form of contraception is most appropriate for you. Document Released: 12/26/2004 Document Revised: 08/28/2012 Document Reviewed: 06/20/2012 [facility] Patient Information 2014 [place], [place].   Oral Contraception Information Oral contraceptives (OCs) are medicines taken to prevent pregnancy. OCs work by preventing the ovaries from releasing eggs. The hormones in OCs also cause the cervical mucus to thicken, preventing the sperm from entering the uterus. The hormones also cause the uterine lining to become thin, not allowing a fertilized egg to attach to the inside of the uterus. OCs are highly effective when taken exactly as prescribed. However, OCs do not prevent sexually transmitted diseases (STDs). Safe sex practices, such as using condoms along with the pill, can help prevent STDs.  Before taking the pill, you may have a physical exam and Pap test. Your caregiver may order blood tests that may be necessary. Your caregiver will make sure you are a good candidate for oral contraception. Discuss with your caregiver the possible side effects of the OC you may be  prescribed. When starting an OC, it can take 2 to 3 months for the body to adjust to the changes in hormone levels in your body.  TYPES OF ORAL CONTRACEPTION  The combination pill. This pill contains estrogen and progestin (synthetic progesterone) hormones. The combination pill comes in either 21-day or 28-day packs. With 21-day packs, you do not take pills for 7 days after the last pill. With 28-day packs, the pill is taken every day. The last 7 pills  are without hormones. Certain types of pills have more than 21 hormone-containing pills.  The minipill. This pill contains the progesterone hormone only. It is taken every day continuously. The minipill comes in packs of 91 pills. The first 84 pills contain the hormones, and the last 7 pills do not. The last 7 days are when you will have your menstrual period. You may experience irregular spotting. ADVANTAGES  Decreases premenstrual symptoms.  Treats menstrual period cramps.  Regulates the menstrual cycle.  Decreases a heavy menstrual flow.  Treats acne.  Treats abnormal uterine bleeding.  Treats chronic pelvic pain.  Treats polycystic ovarian syndrome.  Treats endometriosis.  Can be used as emergency contraception. DISADVANTAGES OCs can be less effective if:  You forget to take the pill at the same time every day.  You have a stomach or intestinal disease that lessens the absorption of the pill.  You take OCs with other medicines that make OCs less effective.  You take expired OCs.  You forget to restart the pill on day 7, when using the packs of 21 pills. Document Released: 03/18/2002 Document Revised: 03/20/2011 Document Reviewed: 06/16/2012 [facility] Patient Information 2014 [place], [place].

## 2013-01-17 NOTE — Progress Notes (Signed)
   Subjective:    Patient ID: Carol Silva, female    DOB: 12/17/1970, 43 y.o.   MRN: [id number]  HPI Pt presents to the clinic to start birth control for pregnancy prevention. She has had IUD after children born in 2002. IUD out in 2012. She tried to place another on and had reaction where her body wanted to reject it. Every since she has not had birth control. She has been using condoms and pull out method. Periods have been much heaver without birth control and she gets acne around her period. Last pap 07/2011. One partner, her husband. Does not smoke.     Review of Systems     Objective:   Physical Exam  Constitutional: She is oriented to person, place, and time. She appears well-developed and well-nourished.  HENT:  Head: Normocephalic and atraumatic.  Cardiovascular: Normal rate, regular rhythm and normal heart sounds.   Pulmonary/Chest: Effort normal and breath sounds normal.  Neurological: She is alert and oriented to person, place, and time.  Skin: Skin is dry.  Psychiatric: She has a normal mood and affect. Her behavior is normal.          Assessment & Plan:  Contraception management/heavy periods/acne- pt started on birth control. Pap up to date. Discussed Sunday start after next period. Discussed what to do if missed a dose. Other options of birth control were discussed. SE of blood clots, nausea, and weight gained were discussed. What do to with missed pills were gone over. Condoms for first 7 days when start therapy. Encouraged use of condoms for STD protection. orginally rx ortho tri cyclen but necon was came up on insurance approved list.   Spent 30 minutes with pt and greater than 50 percent of visit spent counseling pt regarding birth control choices.

## 2013-01-19 DIAGNOSIS — N920 Excessive and frequent menstruation with regular cycle: Secondary | ICD-10-CM | POA: Insufficient documentation

## 2013-01-19 DIAGNOSIS — L709 Acne, unspecified: Secondary | ICD-10-CM | POA: Insufficient documentation

## 2013-06-03 ENCOUNTER — Other Ambulatory Visit: Payer: Self-pay | Admitting: Physician Assistant

## 2013-06-03 DIAGNOSIS — N6002 Solitary cyst of left breast: Secondary | ICD-10-CM

## 2013-07-01 ENCOUNTER — Telehealth: Payer: Self-pay | Admitting: Physician Assistant

## 2013-07-01 NOTE — Telephone Encounter (Signed)
Message copied by Jomarie LongsBREEBACK, JADE L on Tue Jul 01, 2013 11:12 AM ------      Message from: Tandy GawBREEBACK, JADE L      Created: Thu Dec 19, 2012 10:43 PM       Follow up left breast u/s ------

## 2013-07-15 ENCOUNTER — Encounter (INDEPENDENT_AMBULATORY_CARE_PROVIDER_SITE_OTHER): Payer: Self-pay

## 2013-07-15 ENCOUNTER — Ambulatory Visit
Admission: RE | Admit: 2013-07-15 | Discharge: 2013-07-15 | Disposition: A | Discharge status: Home / Self Care | Payer: BC Managed Care – PPO | Source: Ambulatory Visit | Attending: Physician Assistant | Admitting: Physician Assistant

## 2013-07-15 DIAGNOSIS — N6002 Solitary cyst of left breast: Secondary | ICD-10-CM

## 2013-11-13 ENCOUNTER — Encounter: Payer: Self-pay | Admitting: Emergency Medicine

## 2013-11-13 ENCOUNTER — Emergency Department
Admission: EM | Admit: 2013-11-13 | Discharge: 2013-11-13 | Disposition: A | Discharge status: Home / Self Care | Payer: BC Managed Care – PPO | Source: Home / Self Care | Attending: Emergency Medicine | Admitting: Emergency Medicine

## 2013-11-13 DIAGNOSIS — H109 Unspecified conjunctivitis: Secondary | ICD-10-CM

## 2013-11-13 MED ORDER — TOBRAMYCIN 0.3 % OP SOLN: 2.0000 [drp] | OPHTHALMIC | Status: DC

## 2013-11-13 NOTE — ED Notes (Signed)
Eyes are red, hurting, left eye burns, right eye red and hurting, has a headache described as pressure, 5/10

## 2013-11-13 NOTE — Discharge Instructions (Signed)

## 2013-11-13 NOTE — ED Provider Notes (Signed)
CSN: [id number]     Arrival date & time 11/13/13  1327 History   First MD Initiated Contact with Patient 11/13/13 1345     Chief Complaint  Patient presents with  . Eye Problem   (Consider location/radiation/quality/duration/timing/severity/associated sxs/prior Treatment) Patient is a 43 y.o. female presenting with eye problem. The history is provided by the patient. No language interpreter was used.  Eye Problem Location:  Both Quality:  Aching Severity:  Mild Onset quality:  Sudden Timing:  Constant Progression:  Worsening Chronicity:  New Relieved by:  Nothing Worsened by:  Nothing tried Ineffective treatments:  None tried Associated symptoms: discharge and redness   Associated symptoms: no blurred vision, no crusting and no decreased vision     Past Medical History  Diagnosis Date  . ADD (attention deficit disorder) 09/01/2011  . GERD (gastroesophageal reflux disease) 09/01/2011   Past Surgical History  Procedure Laterality Date  . Cesarean section     Family History  Problem Relation Age of Onset  . Diabetes Mother   . Hyperlipidemia Father   . Stroke Paternal Grandfather   . Diabetes Paternal Grandfather    History  Substance Use Topics  . Smoking status: Never Smoker   . Smokeless tobacco: Not on file  . Alcohol Use: Yes   OB History    No data available     Review of Systems  Eyes: Positive for discharge and redness. Negative for blurred vision.  All other systems reviewed and are negative.   Allergies  Review of patient's allergies indicates not on file.  Home Medications   Prior to Admission medications   Medication Sig Start Date End Date Taking? Authorizing Provider  fluticasone (FLONASE) 50 MCG/ACT nasal spray Place 2 sprays into the nose as needed. 09/16/12   Jade L Breeback, PA-C  Norethindrone-Mestranol (NECON 1/50, 28,) 1-50 MG-MCG tablet Take 1 tablet by mouth daily. 01/17/13   Jade L Breeback, PA-C  tobramycin (TOBREX) 0.3 % ophthalmic  solution Place 2 drops into both eyes every 4 (four) hours. 11/13/13   Elson AreasLeslie K Sofia, PA-C   BP 106/69 mmHg  Pulse 62  Temp(Src) 98.3 F (36.8 C) (Oral)  Ht 5\' 1"  (1.549 m)  Wt 113 lb (51.256 kg)  BMI 21.36 kg/m2  SpO2 100% Physical Exam  Constitutional: She appears well-developed and well-nourished.  HENT:  Head: Normocephalic.  Eyes: Conjunctivae are normal. Pupils are equal, round, and reactive to light.  Erythema,  Injected right eye,  Slight erythema left eye  Cardiovascular: Normal rate.   Pulmonary/Chest: Effort normal.  Musculoskeletal: Normal range of motion.  Skin: Skin is warm.  Psychiatric: She has a normal mood and affect.  Nursing note and vitals reviewed.   ED Course  Procedures (including critical care time) Labs Review Labs Reviewed - No data to display  Imaging Review No results found.   MDM   1. Conjunctivitis, right eye    tobrex AVS    Elson AreasLeslie K Sofia, PA-C 11/13/13 1428

## 2014-01-06 ENCOUNTER — Telehealth: Payer: Self-pay | Admitting: Physician Assistant

## 2014-01-06 ENCOUNTER — Other Ambulatory Visit: Payer: Self-pay | Admitting: Physician Assistant

## 2014-01-06 DIAGNOSIS — N6002 Solitary cyst of left breast: Secondary | ICD-10-CM

## 2014-01-06 NOTE — Telephone Encounter (Signed)
-----   Message from Jomarie LongsJade L Shawntee Mainwaring, [place] JerseyPA-C sent at 07/15/2013 10:57 AM EDT ----- Diagnostic mammogram December 2015. Left breat cyst

## 2014-01-06 NOTE — Telephone Encounter (Signed)
Call pt: have note needs diagnostic mammogram. Is this scheduled already?

## 2014-01-22 NOTE — Telephone Encounter (Signed)
Scheduled for tomorrow.

## 2014-01-23 ENCOUNTER — Other Ambulatory Visit: Payer: BC Managed Care – PPO

## 2014-04-06 ENCOUNTER — Ambulatory Visit (INDEPENDENT_AMBULATORY_CARE_PROVIDER_SITE_OTHER): Payer: BLUE CROSS/BLUE SHIELD | Admitting: Physician Assistant

## 2014-04-06 ENCOUNTER — Other Ambulatory Visit: Payer: Self-pay | Admitting: Physician Assistant

## 2014-04-06 ENCOUNTER — Ambulatory Visit
Admission: RE | Admit: 2014-04-06 | Discharge: 2014-04-06 | Disposition: A | Discharge status: Home / Self Care | Payer: BLUE CROSS/BLUE SHIELD | Source: Ambulatory Visit | Attending: Physician Assistant | Admitting: Physician Assistant

## 2014-04-06 ENCOUNTER — Encounter: Payer: Self-pay | Admitting: Physician Assistant

## 2014-04-06 ENCOUNTER — Other Ambulatory Visit (HOSPITAL_COMMUNITY)
Admission: RE | Admit: 2014-04-06 | Discharge: 2014-04-06 | Disposition: A | Discharge status: Home / Self Care | Payer: BLUE CROSS/BLUE SHIELD | Source: Ambulatory Visit | Attending: Physician Assistant | Admitting: Physician Assistant

## 2014-04-06 VITALS — BP 85/55 | HR 77 | Ht 61.0 in | Wt 113.0 lb

## 2014-04-06 DIAGNOSIS — N6002 Solitary cyst of left breast: Secondary | ICD-10-CM

## 2014-04-06 DIAGNOSIS — H1010 Acute atopic conjunctivitis, unspecified eye: Secondary | ICD-10-CM | POA: Insufficient documentation

## 2014-04-06 DIAGNOSIS — Z1151 Encounter for screening for human papillomavirus (HPV): Secondary | ICD-10-CM | POA: Diagnosis present

## 2014-04-06 DIAGNOSIS — Z13220 Encounter for screening for lipoid disorders: Secondary | ICD-10-CM

## 2014-04-06 DIAGNOSIS — Z01419 Encounter for gynecological examination (general) (routine) without abnormal findings: Secondary | ICD-10-CM | POA: Diagnosis not present

## 2014-04-06 DIAGNOSIS — Z131 Encounter for screening for diabetes mellitus: Secondary | ICD-10-CM

## 2014-04-06 DIAGNOSIS — R748 Abnormal levels of other serum enzymes: Secondary | ICD-10-CM | POA: Diagnosis not present

## 2014-04-06 DIAGNOSIS — N760 Acute vaginitis: Secondary | ICD-10-CM | POA: Diagnosis present

## 2014-04-06 DIAGNOSIS — N6310 Unspecified lump in the right breast, unspecified quadrant: Secondary | ICD-10-CM | POA: Insufficient documentation

## 2014-04-06 DIAGNOSIS — Z113 Encounter for screening for infections with a predominantly sexual mode of transmission: Secondary | ICD-10-CM | POA: Diagnosis present

## 2014-04-06 DIAGNOSIS — Z0000 Encounter for general adult medical examination without abnormal findings: Secondary | ICD-10-CM

## 2014-04-06 DIAGNOSIS — H1013 Acute atopic conjunctivitis, bilateral: Secondary | ICD-10-CM

## 2014-04-06 LAB — LIPID PANEL
Cholesterol: 164 mg/dL (ref 0–200)
HDL: 57 mg/dL (ref >=46)
LDL CALC: 93 mg/dL (ref 0–99)
Total CHOL/HDL Ratio: 2.9 Ratio
Triglycerides: 70 mg/dL (ref <150)
VLDL: 14 mg/dL (ref 0–40)

## 2014-04-06 LAB — COMPLETE METABOLIC PANEL WITH GFR
ALT: 11 U/L (ref 0–35)
AST: 16 U/L (ref 0–37)
Albumin: 4.1 g/dL (ref 3.5–5.2)
Alkaline Phosphatase: 78 U/L (ref 39–117)
BILIRUBIN TOTAL: 0.6 mg/dL (ref 0.2–1.2)
BUN: 11 mg/dL (ref 6–23)
CHLORIDE: 106 meq/L (ref 96–112)
CO2: 23 mEq/L (ref 19–32)
Calcium: 8.8 mg/dL (ref 8.4–10.5)
Creat: 0.58 mg/dL (ref 0.50–1.10)
GFR, Est African American: >89 mL/min
GFR, Est Non African American: >89 mL/min
Glucose, Bld: 83 mg/dL (ref 70–99)
Potassium: 4 mEq/L (ref 3.5–5.3)
Sodium: 139 mEq/L (ref 135–145)
Total Protein: 6.5 g/dL (ref 6.0–8.3)

## 2014-04-06 LAB — CBC WITH DIFFERENTIAL/PLATELET
Basophils Absolute: 0 10*3/uL (ref 0.0–0.1)
Basophils Relative: 0 % (ref 0–1)
EOS ABS: 0.1 10*3/uL (ref 0.0–0.7)
Eosinophils Relative: 2 % (ref 0–5)
HCT: 39.5 % (ref 36.0–46.0)
HEMOGLOBIN: 13.4 g/dL (ref 12.0–15.0)
Lymphocytes Relative: 32 % (ref 12–46)
Lymphs Abs: 2 10*3/uL (ref 0.7–4.0)
MCH: 32 pg (ref 26.0–34.0)
MCHC: 33.9 g/dL (ref 30.0–36.0)
MCV: 94.3 fL (ref 78.0–100.0)
MONOS PCT: 8 % (ref 3–12)
MPV: 9.7 fL (ref 8.6–12.4)
Monocytes Absolute: 0.5 10*3/uL (ref 0.1–1.0)
Neutro Abs: 3.7 10*3/uL (ref 1.7–7.7)
Neutrophils Relative %: 58 % (ref 43–77)
Platelets: 284 10*3/uL (ref 150–400)
RBC: 4.19 MIL/uL (ref 3.87–5.11)
RDW: 12.8 % (ref 11.5–15.5)
WBC: 6.3 10*3/uL (ref 4.0–10.5)

## 2014-04-06 LAB — FERRITIN: Ferritin: 61 ng/mL (ref 10–291)

## 2014-04-06 LAB — VITAMIN B12: Vitamin B-12: 479 pg/mL (ref 211–911)

## 2014-04-06 MED ORDER — AZELASTINE HCL 0.05 % OP SOLN: 1.0000 [drp] | Freq: Two times a day (BID) | OPHTHALMIC | Status: DC

## 2014-04-06 NOTE — Patient Instructions (Signed)

## 2014-04-06 NOTE — Progress Notes (Signed)
  Subjective:     Carol Silva is a 44 y.o. female and is here for a comprehensive physical exam. The patient reports problems - patient does have some itchy, watery, yellow tint eyes bilaterally. seems to be worsening over last month. .  History   Social History  . Marital Status: Divorced    Spouse Name: N/A  . Number of Children: N/A  . Years of Education: N/A   Occupational History  . Not on file.   Social History Main Topics  . Smoking status: Never Smoker   . Smokeless tobacco: Not on file  . Alcohol Use: Yes  . Drug Use: No  . Sexual Activity: Yes   Other Topics Concern  . Not on file   Social History Narrative   Health Maintenance  Topic Date Due  . HIV Screening  11/04/1987  . MAMMOGRAM  12/19/2013  . INFLUENZA VACCINE  10/07/2014 (Originally 08/09/2013)  . PAP SMEAR  08/07/2014  . TETANUS/TDAP  08/07/2018    The following portions of the patient's history were reviewed and updated as appropriate: allergies, current medications, past family history, past medical history, past social history, past surgical history and problem list.  Review of Systems A comprehensive review of systems was negative.   Objective:    BP 85/55 mmHg  Pulse 77  Ht 5\' 1"  (1.549 m)  Wt 113 lb (51.256 kg)  BMI 21.36 kg/m2 General appearance: alert, cooperative and appears stated age Head: Normocephalic, without obvious abnormality, atraumatic Eyes: conjunctivae/corneas clear. PERRL, EOM's intact. Fundi benign., bilateral eyes watery and slightly injected.  Ears: normal TM's and external ear canals both ears Nose: Nares normal. Septum midline. Mucosa normal. No drainage or sinus tenderness. Throat: lips, mucosa, and tongue normal; teeth and gums normal Neck: no adenopathy, no carotid bruit, no JVD, supple, symmetrical, trachea midline and thyroid not enlarged, symmetric, no tenderness/mass/nodules Back: symmetric, no curvature. ROM normal. No CVA tenderness. Lungs: clear to  auscultation bilaterally Breasts: not done. mammogram scheduled for today.  Heart: regular rate and rhythm, S1, S2 normal, no murmur, click, rub or gallop Abdomen: soft, non-tender; bowel sounds normal; no masses,  no organomegaly Pelvic: cervix normal in appearance, external genitalia normal, no adnexal masses or tenderness, no cervical motion tenderness, uterus normal size, shape, and consistency and vagina normal without discharge Extremities: extremities normal, atraumatic, no cyanosis or edema Pulses: 2+ and symmetric Skin: Skin color, texture, turgor normal. No rashes or lesions Lymph nodes: Cervical, supraclavicular, and axillary nodes normal. Neurologic: Grossly normal    Assessment:    Healthy female exam.      Plan:    CPE- mammogram scheduled for today. Pap done today. Vaccines up to date.  Labs ordered for fasting. Discussed need for vitamin D 800 units and calicum 1200mg . Encouraged regular exercise.   Allergic conjunctivitis- given optivar to try. Continue on allergra-D.  See After Visit Summary for Counseling Recommendations

## 2014-04-07 LAB — VITAMIN D 25 HYDROXY (VIT D DEFICIENCY, FRACTURES): Vit D, 25-Hydroxy: 32 ng/mL (ref 30–100)

## 2014-04-08 ENCOUNTER — Other Ambulatory Visit: Payer: Self-pay | Admitting: *Deleted

## 2014-04-08 LAB — CERVICOVAGINAL ANCILLARY ONLY
Bacterial vaginitis: POSITIVE — AB
Candida vaginitis: NEGATIVE

## 2014-04-08 LAB — CYTOLOGY - PAP

## 2014-04-08 MED ORDER — METRONIDAZOLE 500 MG PO TABS: 500.0000 mg | ORAL_TABLET | Freq: Two times a day (BID) | ORAL | Status: DC

## 2014-04-13 LAB — CERVICOVAGINAL ANCILLARY ONLY: HERPES (WINDOWPATH): NEGATIVE

## 2014-04-27 ENCOUNTER — Other Ambulatory Visit: Payer: Self-pay | Admitting: Surgery

## 2014-04-27 DIAGNOSIS — N6099 Unspecified benign mammary dysplasia of unspecified breast: Secondary | ICD-10-CM

## 2014-05-12 ENCOUNTER — Other Ambulatory Visit: Payer: Self-pay | Admitting: Surgery

## 2014-05-12 DIAGNOSIS — N6099 Unspecified benign mammary dysplasia of unspecified breast: Secondary | ICD-10-CM

## 2014-05-20 ENCOUNTER — Encounter (HOSPITAL_BASED_OUTPATIENT_CLINIC_OR_DEPARTMENT_OTHER): Payer: Self-pay | Admitting: *Deleted

## 2014-05-25 ENCOUNTER — Ambulatory Visit
Admission: RE | Admit: 2014-05-25 | Discharge: 2014-05-25 | Disposition: A | Discharge status: Home / Self Care | Payer: BLUE CROSS/BLUE SHIELD | Source: Ambulatory Visit | Attending: Surgery | Admitting: Surgery

## 2014-05-25 DIAGNOSIS — N6099 Unspecified benign mammary dysplasia of unspecified breast: Secondary | ICD-10-CM

## 2014-05-25 NOTE — H&P (Signed)
Carol LoudYesenia Y. Coco 04/27/2014 10:50 AM Location: [facility] Patient #: [id number] DOB: 10-01-1970 Divorced / Language: [place] / Race: Undefined Female  History of Present Illness (Dametra Whetsel A. Magnus IvanBlackman MD; 04/27/2014 11:30 AM) Patient words: right breast mass.  The patient is a 44 year old female who presents with a breast mass. She is referred here by Dr. Dalphine HandingErin Shaw at the breast center after a recent right breast centered at biopsy revealed atypical cells in the right breast. These were found on screening mammography that been followed. She has had previous cyst in her breast but has not had a biopsy in the past. She has had bilateral implants placed in the past as well. She denies nipple discharge and any other abnormalities. She is otherwise healthy and without complaints.   Other Problems Fay Records(Ashley Beck, CMA; 04/27/2014 10:50 AM) No pertinent past medical history  Past Surgical History Fay Records(Ashley Beck, CMA; 04/27/2014 10:50 AM) Breast Augmentation Bilateral. Breast Biopsy Right. Cesarean Section - 1  Diagnostic Studies History Fay Records(Ashley Beck, CMA; 04/27/2014 10:50 AM) Colonoscopy never Mammogram within last year Pap Smear 1-5 years ago  Allergies Fay Records(Ashley Beck, CMA; 04/27/2014 10:50 AM) No Known Drug Allergies04/18/2016  Medication History Fay Records(Ashley Beck, CMA; 04/27/2014 10:50 AM) Azelastine HCl (0.05% Solution, Ophthalmic) Active. MetroNIDAZOLE (500MG  Tablet, Oral) Active. Tobramycin (0.3% Solution, Ophthalmic) Active. Medications Reconciled  Social History Fay Records(Ashley Beck, [place]; 04/27/2014 10:50 AM) Alcohol use Occasional alcohol use. No drug use Tobacco use Never smoker.  Family History Fay Records(Ashley Beck, [place]; 04/27/2014 10:50 AM) Colon Polyps Father, Mother. Diabetes Mellitus Mother. Migraine Headache Mother. Thyroid problems Father.  Pregnancy / Birth History Fay Records(Ashley Beck, CMA; 04/27/2014 10:50 AM) Age at menarche 15 years. Contraceptive History  Intrauterine device. Gravida 1 Maternal age 42-30 Para 1 Regular periods  Review of Systems Fay Records(Ashley Beck CMA; 04/27/2014 10:50 AM) General Not Present- Appetite Loss, Chills, Fatigue, Fever, Night Sweats, Weight Gain and Weight Loss. Skin Not Present- Change in Wart/Mole, Dryness, Hives, Jaundice, New Lesions, Non-Healing Wounds, Rash and Ulcer. HEENT Present- Seasonal Allergies. Not Present- Earache, Hearing Loss, Hoarseness, Nose Bleed, Oral Ulcers, Ringing in the Ears, Sinus Pain, Sore Throat, Visual Disturbances, Wears glasses/contact lenses and Yellow Eyes. Respiratory Not Present- Bloody sputum, Chronic Cough, Difficulty Breathing, Snoring and Wheezing. Cardiovascular Not Present- Chest Pain, Difficulty Breathing Lying Down, Leg Cramps, Palpitations, Rapid Heart Rate, Shortness of Breath and Swelling of Extremities. Gastrointestinal Not Present- Abdominal Pain, Bloating, Bloody Stool, Change in Bowel Habits, Chronic diarrhea, Constipation, Difficulty Swallowing, Excessive gas, Gets full quickly at meals, Hemorrhoids, Indigestion, Nausea, Rectal Pain and Vomiting. Female Genitourinary Not Present- Frequency, Nocturia, Painful Urination, Pelvic Pain and Urgency. Musculoskeletal Not Present- Back Pain, Joint Pain, Joint Stiffness, Muscle Pain, Muscle Weakness and Swelling of Extremities. Neurological Not Present- Decreased Memory, Fainting, Headaches, Numbness, Seizures, Tingling, Tremor, Trouble walking and Weakness. Psychiatric Not Present- Anxiety, Bipolar, Change in Sleep Pattern, Depression, Fearful and Frequent crying. Endocrine Not Present- Cold Intolerance, Excessive Hunger, Hair Changes, Heat Intolerance, Hot flashes and New Diabetes. Hematology Not Present- Easy Bruising, Excessive bleeding, Gland problems, HIV and Persistent Infections.   Vitals Fay Records(Ashley Beck CMA; 04/27/2014 10:51 AM) 04/27/2014 10:50 AM Weight: 112 lb Height: 61in Body Surface Area: 1.48 m Body Mass  Index: 21.16 kg/m Temp.: 97.32F(Oral)  Pulse: 72 (Regular)  Resp.: 17 (Unlabored)  BP: 128/78 (Sitting, Left Arm, Standard)    Physical Exam (Jorma Tassinari A. Magnus IvanBlackman MD; 04/27/2014 11:31 AM) General Mental Status-Alert. General Appearance-Consistent with stated age. Hydration-Well hydrated. Voice-Normal.  Head and Neck Head-normocephalic, atraumatic with  no lesions or palpable masses. Trachea-midline. Thyroid Gland Characteristics - normal size and consistency.  Eye Eyeball - Bilateral-Extraocular movements intact. Sclera/Conjunctiva - Bilateral-No scleral icterus.  Chest and Lung Exam Chest and lung exam reveals -quiet, even and easy respiratory effort with no use of accessory muscles and on auscultation, normal breath sounds, no adventitious sounds and normal vocal resonance. Inspection Chest Wall - Normal. Back - normal.  Breast Breast - Left-Symmetric, Non Tender, No Biopsy scars, no Dimpling, No Inflammation, No Lumpectomy scars, No Mastectomy scars, No Peau d' Orange. Breast - Right-Symmetric, Non Tender, No Biopsy scars, no Dimpling, No Inflammation, No Lumpectomy scars, No Mastectomy scars, No Peau d' Orange. Breast Lump-No Palpable Breast Mass. Note: Bilateral breast implants with inframammary incisions   Cardiovascular Cardiovascular examination reveals -normal heart sounds, regular rate and rhythm with no murmurs and normal pedal pulses bilaterally.  Abdomen - Did not examine.  Neurologic - Did not examine.  Musculoskeletal Normal Exam - Left-Upper Extremity Strength Normal and Lower Extremity Strength Normal. Normal Exam - Right-Upper Extremity Strength Normal and Lower Extremity Strength Normal.  Lymphatic Head & Neck  General Head & Neck Lymphatics: Bilateral - Description - Normal. Axillary  General Axillary Region: Bilateral - Description - Normal. Tenderness - Non Tender. Femoral & Inguinal  Generalized Femoral  & Inguinal Lymphatics: Bilateral - Description - Normal. Tenderness - Non Tender.    Assessment & Plan (Cherice Glennie A. 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 I discussed this and the reasoning for this with the patient in detail. I explained the surgical procedure in detail. I discussed the risks which includes but is not limited to bleeding, infection, injury to surrounding structures including her breast implant, need for further surgery if malignancy is present, etc. I also discussed postoperative recovery. She understands and wishes to proceed with surgery.

## 2014-05-26 ENCOUNTER — Ambulatory Visit (HOSPITAL_BASED_OUTPATIENT_CLINIC_OR_DEPARTMENT_OTHER): Payer: BLUE CROSS/BLUE SHIELD | Admitting: Certified Registered"

## 2014-05-26 ENCOUNTER — Ambulatory Visit (HOSPITAL_BASED_OUTPATIENT_CLINIC_OR_DEPARTMENT_OTHER)
Admission: RE | Admit: 2014-05-26 | Discharge: 2014-05-26 | Disposition: A | Discharge status: Home / Self Care | Payer: BLUE CROSS/BLUE SHIELD | Source: Ambulatory Visit | Attending: Surgery | Admitting: Surgery

## 2014-05-26 ENCOUNTER — Encounter (HOSPITAL_BASED_OUTPATIENT_CLINIC_OR_DEPARTMENT_OTHER)
Admission: RE | Disposition: A | Discharge status: Home / Self Care | Payer: Self-pay | Source: Ambulatory Visit | Attending: Surgery

## 2014-05-26 ENCOUNTER — Ambulatory Visit
Admission: RE | Admit: 2014-05-26 | Discharge: 2014-05-26 | Disposition: A | Discharge status: Home / Self Care | Payer: BLUE CROSS/BLUE SHIELD | Source: Ambulatory Visit | Attending: Surgery | Admitting: Surgery

## 2014-05-26 ENCOUNTER — Encounter (HOSPITAL_BASED_OUTPATIENT_CLINIC_OR_DEPARTMENT_OTHER): Payer: Self-pay

## 2014-05-26 DIAGNOSIS — R921 Mammographic calcification found on diagnostic imaging of breast: Secondary | ICD-10-CM | POA: Diagnosis not present

## 2014-05-26 DIAGNOSIS — N6099 Unspecified benign mammary dysplasia of unspecified breast: Secondary | ICD-10-CM

## 2014-05-26 DIAGNOSIS — F988 Other specified behavioral and emotional disorders with onset usually occurring in childhood and adolescence: Secondary | ICD-10-CM | POA: Diagnosis not present

## 2014-05-26 DIAGNOSIS — R928 Other abnormal and inconclusive findings on diagnostic imaging of breast: Secondary | ICD-10-CM | POA: Diagnosis present

## 2014-05-26 DIAGNOSIS — Z792 Long term (current) use of antibiotics: Secondary | ICD-10-CM | POA: Diagnosis not present

## 2014-05-26 DIAGNOSIS — K219 Gastro-esophageal reflux disease without esophagitis: Secondary | ICD-10-CM | POA: Diagnosis not present

## 2014-05-26 DIAGNOSIS — N6021 Fibroadenosis of right breast: Secondary | ICD-10-CM | POA: Insufficient documentation

## 2014-05-26 DIAGNOSIS — N62 Hypertrophy of breast: Secondary | ICD-10-CM | POA: Insufficient documentation

## 2014-05-26 DIAGNOSIS — Z79899 Other long term (current) drug therapy: Secondary | ICD-10-CM | POA: Insufficient documentation

## 2014-05-26 HISTORY — PX: BREAST LUMPECTOMY WITH RADIOACTIVE SEED LOCALIZATION: SHX6424

## 2014-05-26 LAB — POCT HEMOGLOBIN-HEMACUE: HEMOGLOBIN: 13.1 g/dL (ref 12.0–15.0)

## 2014-05-26 SURGERY — BREAST LUMPECTOMY WITH RADIOACTIVE SEED LOCALIZATION
Anesthesia: General | Site: Breast | Laterality: Right

## 2014-05-26 MED ORDER — ACETAMINOPHEN 650 MG RE SUPP: 650.0000 mg | RECTAL | Status: DC | PRN

## 2014-05-26 MED ORDER — DEXAMETHASONE SODIUM PHOSPHATE 4 MG/ML IJ SOLN
INTRAMUSCULAR | Status: DC | PRN
  Administered 2014-05-26: 10 mg via INTRAVENOUS

## 2014-05-26 MED ORDER — MORPHINE SULFATE 2 MG/ML IJ SOLN: 1.0000 mg | INTRAMUSCULAR | Status: DC | PRN

## 2014-05-26 MED ORDER — SODIUM CHLORIDE 0.9 % IJ SOLN: 3.0000 mL | Freq: Two times a day (BID) | INTRAMUSCULAR | Status: DC

## 2014-05-26 MED ORDER — PROPOFOL 10 MG/ML IV BOLUS
INTRAVENOUS | Status: DC | PRN
  Administered 2014-05-26: 100 mg via INTRAVENOUS

## 2014-05-26 MED ORDER — MIDAZOLAM HCL 2 MG/2ML IJ SOLN
INTRAMUSCULAR | Status: AC
  Filled 2014-05-26: qty 2

## 2014-05-26 MED ORDER — LACTATED RINGERS IV SOLN
INTRAVENOUS | Status: DC
  Administered 2014-05-26 (×2): via INTRAVENOUS

## 2014-05-26 MED ORDER — SODIUM CHLORIDE 0.9 % IV SOLN: 250.0000 mL | INTRAVENOUS | Status: DC | PRN

## 2014-05-26 MED ORDER — HYDROCODONE-ACETAMINOPHEN 5-325 MG PO TABS: 1.0000 | ORAL_TABLET | ORAL | Status: DC | PRN

## 2014-05-26 MED ORDER — ONDANSETRON HCL 4 MG/2ML IJ SOLN
INTRAMUSCULAR | Status: DC | PRN
  Administered 2014-05-26: 4 mg via INTRAVENOUS

## 2014-05-26 MED ORDER — KETOROLAC TROMETHAMINE 30 MG/ML IJ SOLN
INTRAMUSCULAR | Status: DC | PRN
  Administered 2014-05-26: 30 mg via INTRAVENOUS

## 2014-05-26 MED ORDER — CEFAZOLIN SODIUM-DEXTROSE 2-3 GM-% IV SOLR
INTRAVENOUS | Status: AC
  Filled 2014-05-26: qty 50

## 2014-05-26 MED ORDER — GLYCOPYRROLATE 0.2 MG/ML IJ SOLN: 0.2000 mg | Freq: Once | INTRAMUSCULAR | Status: DC | PRN

## 2014-05-26 MED ORDER — FENTANYL CITRATE (PF) 100 MCG/2ML IJ SOLN: 50.0000 ug | INTRAMUSCULAR | Status: DC | PRN

## 2014-05-26 MED ORDER — OXYCODONE HCL 5 MG PO TABS
5.0000 mg | ORAL_TABLET | Freq: Once | ORAL | Status: AC
  Administered 2014-05-26: 5 mg via ORAL

## 2014-05-26 MED ORDER — BUPIVACAINE-EPINEPHRINE (PF) 0.5% -1:200000 IJ SOLN
INTRAMUSCULAR | Status: AC
  Filled 2014-05-26: qty 30

## 2014-05-26 MED ORDER — OXYCODONE HCL 5 MG PO TABS
ORAL_TABLET | ORAL | Status: AC
  Filled 2014-05-26: qty 1

## 2014-05-26 MED ORDER — CEFAZOLIN SODIUM-DEXTROSE 2-3 GM-% IV SOLR
2.0000 g | INTRAVENOUS | Status: AC
  Administered 2014-05-26: 2 g via INTRAVENOUS

## 2014-05-26 MED ORDER — LIDOCAINE HCL (CARDIAC) 20 MG/ML IV SOLN
INTRAVENOUS | Status: DC | PRN
  Administered 2014-05-26: 60 mg via INTRAVENOUS

## 2014-05-26 MED ORDER — FENTANYL CITRATE (PF) 100 MCG/2ML IJ SOLN
INTRAMUSCULAR | Status: DC | PRN
  Administered 2014-05-26: 50 ug via INTRAVENOUS

## 2014-05-26 MED ORDER — FENTANYL CITRATE (PF) 100 MCG/2ML IJ SOLN
INTRAMUSCULAR | Status: AC
  Filled 2014-05-26: qty 6

## 2014-05-26 MED ORDER — OXYCODONE HCL 5 MG PO TABS: 5.0000 mg | ORAL_TABLET | ORAL | Status: DC | PRN

## 2014-05-26 MED ORDER — MIDAZOLAM HCL 2 MG/2ML IJ SOLN
1.0000 mg | INTRAMUSCULAR | Status: DC | PRN
  Administered 2014-05-26: 2 mg via INTRAVENOUS

## 2014-05-26 MED ORDER — ACETAMINOPHEN 325 MG PO TABS: 650.0000 mg | ORAL_TABLET | ORAL | Status: DC | PRN

## 2014-05-26 MED ORDER — FENTANYL CITRATE (PF) 100 MCG/2ML IJ SOLN
INTRAMUSCULAR | Status: AC
  Filled 2014-05-26: qty 2

## 2014-05-26 MED ORDER — PROPOFOL 500 MG/50ML IV EMUL
INTRAVENOUS | Status: AC
  Filled 2014-05-26: qty 50

## 2014-05-26 MED ORDER — BUPIVACAINE-EPINEPHRINE 0.5% -1:200000 IJ SOLN
INTRAMUSCULAR | Status: DC | PRN
  Administered 2014-05-26: 10 mL

## 2014-05-26 MED ORDER — FENTANYL CITRATE (PF) 100 MCG/2ML IJ SOLN
25.0000 ug | INTRAMUSCULAR | Status: DC | PRN
  Administered 2014-05-26 (×4): 25 ug via INTRAVENOUS

## 2014-05-26 MED ORDER — SODIUM CHLORIDE 0.9 % IJ SOLN: 3.0000 mL | INTRAMUSCULAR | Status: DC | PRN

## 2014-05-26 MED ORDER — ONDANSETRON HCL 4 MG/2ML IJ SOLN: 4.0000 mg | Freq: Once | INTRAMUSCULAR | Status: DC | PRN

## 2014-05-26 SURGICAL SUPPLY — 45 items
BLADE HEX COATED 2.75 (ELECTRODE) ×2 IMPLANT
BLADE SURG 15 STRL LF DISP TIS (BLADE) ×1 IMPLANT
BLADE SURG 15 STRL SS (BLADE) ×2
CANISTER SUCT 1200ML W/VALVE (MISCELLANEOUS) IMPLANT
CHLORAPREP W/TINT 26ML (MISCELLANEOUS) ×2 IMPLANT
CLIP TI WIDE RED SMALL 6 (CLIP) IMPLANT
COVER BACK TABLE 60X90IN (DRAPES) ×2 IMPLANT
COVER MAYO STAND STRL (DRAPES) ×2 IMPLANT
COVER PROBE W GEL 5X96 (DRAPES) ×2 IMPLANT
DECANTER SPIKE VIAL GLASS SM (MISCELLANEOUS) IMPLANT
DEVICE DUBIN W/COMP PLATE 8390 (MISCELLANEOUS) ×2 IMPLANT
DRAPE LAPAROTOMY 100X72 PEDS (DRAPES) ×2 IMPLANT
DRAPE UTILITY XL STRL (DRAPES) ×2 IMPLANT
DRSG TEGADERM 4X4.75 (GAUZE/BANDAGES/DRESSINGS) IMPLANT
ELECT REM PT RETURN 9FT ADLT (ELECTROSURGICAL) ×2
ELECTRODE REM PT RTRN 9FT ADLT (ELECTROSURGICAL) ×1 IMPLANT
GLOVE BIO SURGEON STRL SZ 6.5 (GLOVE) ×2 IMPLANT
GLOVE BIOGEL PI IND STRL 7.0 (GLOVE) ×1 IMPLANT
GLOVE BIOGEL PI INDICATOR 7.0 (GLOVE) ×1
GLOVE EXAM NITRILE MD LF STRL (GLOVE) ×2 IMPLANT
GLOVE SURG SIGNA 7.5 PF LTX (GLOVE) ×2 IMPLANT
GOWN STRL REUS W/ TWL LRG LVL3 (GOWN DISPOSABLE) ×1 IMPLANT
GOWN STRL REUS W/ TWL XL LVL3 (GOWN DISPOSABLE) ×1 IMPLANT
GOWN STRL REUS W/TWL LRG LVL3 (GOWN DISPOSABLE) ×2
GOWN STRL REUS W/TWL XL LVL3 (GOWN DISPOSABLE) ×2
KIT MARKER MARGIN INK (KITS) ×2 IMPLANT
LIQUID BAND (GAUZE/BANDAGES/DRESSINGS) ×2 IMPLANT
NDL HYPO 25X1 1.5 SAFETY (NEEDLE) ×1 IMPLANT
NEEDLE HYPO 25X1 1.5 SAFETY (NEEDLE) ×2 IMPLANT
NS IRRIG 1000ML POUR BTL (IV SOLUTION) IMPLANT
PACK BASIN DAY SURGERY FS (CUSTOM PROCEDURE TRAY) ×2 IMPLANT
PENCIL BUTTON HOLSTER BLD 10FT (ELECTRODE) ×2 IMPLANT
SLEEVE SCD COMPRESS KNEE MED (MISCELLANEOUS) ×2 IMPLANT
SPONGE GAUZE 4X4 12PLY STER LF (GAUZE/BANDAGES/DRESSINGS) ×2 IMPLANT
SPONGE LAP 4X18 X RAY DECT (DISPOSABLE) ×2 IMPLANT
STRIP CLOSURE SKIN 1/2X4 (GAUZE/BANDAGES/DRESSINGS) IMPLANT
SUT MNCRL AB 4-0 PS2 18 (SUTURE) ×2 IMPLANT
SUT SILK 2 0 SH (SUTURE) ×2 IMPLANT
SUT VIC AB 3-0 SH 27 (SUTURE) ×2
SUT VIC AB 3-0 SH 27X BRD (SUTURE) ×1 IMPLANT
SYR CONTROL 10ML LL (SYRINGE) ×2 IMPLANT
TOWEL OR 17X24 6PK STRL BLUE (TOWEL DISPOSABLE) ×2 IMPLANT
TOWEL OR NON WOVEN STRL DISP B (DISPOSABLE) ×2 IMPLANT
TUBE CONNECTING 20X1/4 (TUBING) IMPLANT
YANKAUER SUCT BULB TIP NO VENT (SUCTIONS) IMPLANT

## 2014-05-26 NOTE — Op Note (Signed)
BREAST LUMPECTOMY WITH RADIOACTIVE SEED LOCALIZATION  Procedure Note  Carol Silva 05/26/2014   Pre-op Diagnosis: Atypical Cells Right Breast     Post-op Diagnosis: same  Procedure(s): BREAST LUMPECTOMY WITH RADIOACTIVE SEED LOCALIZATION  Surgeon(s): Abigail Miyamotoouglas Tashawn Greff, MD  Anesthesia: General  Staff:  Circulator: Salley ScarletMary B Davidson, RN Scrub Person: Zenda AlpersMonica Greene V, RN  Estimated Blood Loss: Minimal               Specimens: sent to path          [facility] LLCBLACKMAN,Carol Nevin A   Date: 05/26/2014  Time: 7:52 AM

## 2014-05-26 NOTE — Interval H&P Note (Signed)
History and Physical Interval Note: no change in H and P  05/26/2014 6:55 AM  Carol Silva  has presented today for surgery, with the diagnosis of Atypical Cells Right Breast  The various methods of treatment have been discussed with the patient and family. After consideration of risks, benefits and other options for treatment, the patient has consented to  Procedure(s): BREAST LUMPECTOMY WITH RADIOACTIVE SEED LOCALIZATION (Right) as a surgical intervention .  The patient's history has been reviewed, patient examined, no change in status, stable for surgery.  I have reviewed the patient's chart and labs.  Questions were answered to the patient's satisfaction.     Bibiana Gillean A

## 2014-05-26 NOTE — Transfer of Care (Signed)
Immediate Anesthesia Transfer of Care Note  Patient: Carol Silva  Procedure(s) Performed: Procedure(s): BREAST LUMPECTOMY WITH RADIOACTIVE SEED LOCALIZATION (Right)  Patient Location: PACU  Anesthesia Type:General  Level of Consciousness: awake, alert , oriented and patient cooperative  Airway & Oxygen Therapy: Patient Spontanous Breathing and Patient connected to face mask oxygen  Post-op Assessment: Report given to RN and Post -op Vital signs reviewed and stable  Post vital signs: Reviewed and stable  Last Vitals:  Filed Vitals:   05/26/14 0625  BP: 93/55  Pulse: 59  Temp: 36.8 C  Resp: 16    Complications: No apparent anesthesia complications

## 2014-05-26 NOTE — Anesthesia Preprocedure Evaluation (Addendum)
Anesthesia Evaluation  Patient identified by MRN, date of birth, ID band Patient awake    Reviewed: Allergy & Precautions, NPO status   Airway Mallampati: II  TM Distance: >3 FB Neck ROM: Full    Dental  (+) Teeth Intact, Dental Advisory Given   Pulmonary  breath sounds clear to auscultation        Cardiovascular Rhythm:Regular Rate:Normal     Neuro/Psych ADD Neuromuscular disease    GI/Hepatic GERD-  Medicated,  Endo/Other    Renal/GU      Musculoskeletal   Abdominal   Peds  Hematology   Anesthesia Other Findings   Reproductive/Obstetrics                            Anesthesia Physical Anesthesia Plan  ASA: I  Anesthesia Plan: General   Post-op Pain Management:    Induction: Intravenous  Airway Management Planned: LMA  Additional Equipment:   Intra-op Plan:   Post-operative Plan:   Informed Consent: I have reviewed the patients History and Physical, chart, labs and discussed the procedure including the risks, benefits and alternatives for the proposed anesthesia with the patient or authorized representative who has indicated his/her understanding and acceptance.   Dental advisory given  Plan Discussed with: CRNA and Anesthesiologist  Anesthesia Plan Comments: (Mass with atypical features R. Breast  Plan GA with LMA  Kipp Broodavid Desi Rowe)        Anesthesia Quick Evaluation

## 2014-05-26 NOTE — Discharge Instructions (Signed)
[facility] Office Phone Number [phone]  BREAST BIOPSY/ PARTIAL MASTECTOMY: POST OP INSTRUCTIONS  Always review your discharge instruction sheet given to you by the facility where your surgery was performed.  IF YOU HAVE DISABILITY OR FAMILY LEAVE FORMS, YOU MUST BRING THEM TO THE OFFICE FOR PROCESSING.  DO NOT GIVE THEM TO YOUR DOCTOR.  1. A prescription for pain medication may be given to you upon discharge.  Take your pain medication as prescribed, if needed.  If narcotic pain medicine is not needed, then you may take acetaminophen (Tylenol) or ibuprofen (Advil) as needed. 2. Take your usually prescribed medications unless otherwise directed 3. If you need a refill on your pain medication, please contact your pharmacy.  They will contact our office to request authorization.  Prescriptions will not be filled after 5pm or on week-ends. 4. You should eat very light the first 24 hours after surgery, such as soup, crackers, pudding, etc.  Resume your normal diet the day after surgery. 5. Most patients will experience some swelling and bruising in the breast.  Ice packs and a good support bra will help.  Swelling and bruising can take several days to resolve.  6. It is common to experience some constipation if taking pain medication after surgery.  Increasing fluid intake and taking a stool softener will usually help or prevent this problem from occurring.  A mild laxative (Milk of Magnesia or Miralax) should be taken according to package directions if there are no bowel movements after 48 hours. 7. Unless discharge instructions indicate otherwise, you may remove your bandages 24-48 hours after surgery, and you may shower at that time.  You may have steri-strips (small skin tapes) in place directly over the incision.  These strips should be left on the skin for 7-10 days.  If your surgeon used skin glue on the incision, you may shower in 24 hours.  The glue will flake off over the  next 2-3 weeks.  Any sutures or staples will be removed at the office during your follow-up visit. 8. ACTIVITIES:  You may resume regular daily activities (gradually increasing) beginning the next day.  Wearing a good support bra or sports bra minimizes pain and swelling.  You may have sexual intercourse when it is comfortable. a. You may drive when you no longer are taking prescription pain medication, you can comfortably wear a seatbelt, and you can safely maneuver your car and apply brakes. b. RETURN TO WORK:  When feel well enough ______________________________________________________________________________________ 9. You should see your doctor in the office for a follow-up appointment approximately two weeks after your surgery.  Your doctors nurse will typically make your follow-up appointment when she calls you with your pathology report.  Expect your pathology report 2-3 business days after your surgery.  You may call to check if you do not hear from us after three days. 10. OTHER INSTRUCTIONS: NO VIGOROUS ACTIVITY OR HEAVY LIFTING FOR 1 WEEKS 11. ICE PACK AND IBUPROFEN ALSO FOR PAIN _______________________________________________________________________________________________ _____________________________________________________________________________________________________________________________________ _____________________________________________________________________________________________________________________________________ _____________________________________________________________________________________________________________________________________  WHEN TO CALL YOUR DOCTOR: 1. Fever over 101.0 2. Nausea and/or vomiting. 3. Extreme swelling or bruising. 4. Continued bleeding from incision. 5. Increased pain, redness, or drainage from the incision.  The clinic staff is available to answer your questions during regular business hours.  Please dont hesitate to call and  ask to speak to one of the nurses for clinical concerns.  If you have a medical emergency, go to the nearest emergency room or call 911.  [facility] is always on call at the hospital.  For further questions, please visit [email or web address]    Post Anesthesia Home Care Instructions  Activity: Get plenty of rest for the remainder of the day. A responsible adult should stay with you for 24 hours following the procedure.  For the next 24 hours, DO NOT: -Drive a car -Advertising copywriterperate machinery -Drink alcoholic beverages -Take any medication unless instructed by your physician -Make any legal decisions or sign important papers.  Meals: Start with liquid foods such as gelatin or soup. Progress to regular foods as tolerated. Avoid greasy, spicy, heavy foods. If nausea and/or vomiting occur, drink only clear liquids until the nausea and/or vomiting subsides. Call your physician if vomiting continues.  Special Instructions/Symptoms: Your throat may feel dry or sore from the anesthesia or the breathing tube placed in your throat during surgery. If this causes discomfort, gargle with warm salt water. The discomfort should disappear within 24 hours.  If you had a scopolamine patch placed behind your ear for the management of post- operative nausea and/or vomiting:  1. The medication in the patch is effective for 72 hours, after which it should be removed.  Wrap patch in a tissue and discard in the trash. Wash hands thoroughly with soap and water. 2. You may remove the patch earlier than 72 hours if you experience unpleasant side effects which may include dry mouth, dizziness or visual disturbances. 3. Avoid touching the patch. Wash your hands with soap and water after contact with the patch.

## 2014-05-26 NOTE — Op Note (Signed)
NAMCasimer Silva:  Vining, Ajla               ACCOUNT NO.:  [id number]  MEDICAL RECORD NO.:  [id number]  LOCATION:                                 FACILITY:  PHYSICIAN:  Abigail Miyamotoouglas Sholom Dulude, M.D. DATE OF BIRTH:  01/19/1970  DATE OF PROCEDURE:  05/26/2014 DATE OF DISCHARGE:                              OPERATIVE REPORT   PREOPERATIVE DIAGNOSIS:  Atypical cells of the right breast.  POSTOPERATIVE DIAGNOSIS:  Atypical cells of the right breast.  PROCEDURE:  Radioactive seed localized right breast lumpectomy.  SURGEON:  Abigail Miyamotoouglas Shalaya Swailes, MD  ANESTHESIA:  General and 0.5% Marcaine.  ESTIMATED BLOOD LOSS:  Minimal.  INDICATIONS:  This is a 44 year old female who was found to have a small abnormality in the right breast on mammography.  Biopsy of this mass showed atypical cells.  Decision was made to proceed with a lumpectomy of this area.  FINDINGS:  The suspicious area along with the radioactive seed and previous marker were found under x-ray to be in the biopsy specimen.  PROCEDURE IN DETAIL:  The patient was identified in the holding area as [name].  She had had a radiation seed placed in the breast preoperatively.  Using Neoprobe, I confirmed that it was indeed in the breast.  The patient was then taken to the operating room and placed in a supine position on the operating room table where general anesthesia was induced.  Her right breast was then prepped and draped in usual sterile fashion.  I anesthetized the skin around the medial edge of the areola and made a circumareolar incision with a scalpel.  I took this down to the breast tissue with electrocautery.  With the aid of the Neoprobe, I performed a lumpectomy in the upper inner quadrant removing the mass as well as the seed in its entirety.  The patient has breast implants so I kept the lumpectomy small.  Once the specimen was completely removed, I again confirmed that the radioactive seed was in the lumpectomy specimen.   There was no uptake in the breast once the specimenwas removed.  I then marked the specimen with a marker paint. It was x-rayed and the radioactive seed was confirmed to be in the specimen along with the mass and previous marker.  This was then sent to Pathology for evaluation.  Hemostasis was achieved with the cautery.  I anesthetized the wound further with Marcaine.  I then closed the incision with interrupted 3-0 Vicryl sutures and a running 4-0 Monocryl suture.  Skin glue was then applied.  The patient tolerated the procedure well.  All the counts were correct at the end of the procedure.  The patient was then extubated in the operating room and taken in a stable condition to the recovery room.     Abigail Miyamotoouglas Akayla Brass, M.D.     DB/MEDQ  D:  05/26/2014  T:  05/26/2014  Job:  [id number]

## 2014-05-26 NOTE — Anesthesia Procedure Notes (Signed)
Procedure Name: LMA Insertion Date/Time: 05/26/2014 7:22 AM Performed by: Len Azeez D Pre-anesthesia Checklist: Patient identified, Emergency Drugs available, Suction available and Patient being monitored Patient Re-evaluated:Patient Re-evaluated prior to inductionOxygen Delivery Method: Circle System Utilized Preoxygenation: Pre-oxygenation with 100% oxygen Intubation Type: IV induction Ventilation: Mask ventilation without difficulty LMA: LMA inserted LMA Size: 4.0 Number of attempts: 1 Airway Equipment and Method: Bite block Placement Confirmation: positive ETCO2 Tube secured with: Tape Dental Injury: Teeth and Oropharynx as per pre-operative assessment

## 2014-05-26 NOTE — Anesthesia Postprocedure Evaluation (Signed)
  Anesthesia Post-op Note  Patient: Carol FragminYesenia Y Silva  Procedure(s) Performed: Procedure(s): BREAST LUMPECTOMY WITH RADIOACTIVE SEED LOCALIZATION (Right)  Patient Location: PACU  Anesthesia Type:General  Level of Consciousness: awake, alert  and oriented  Airway and Oxygen Therapy: Patient Spontanous Breathing  Post-op Pain: mild  Post-op Assessment: Post-op Vital signs reviewed, Patient's Cardiovascular Status Stable, Respiratory Function Stable, Patent Airway and Pain level controlled  Post-op Vital Signs: stable  Last Vitals:  Filed Vitals:   05/26/14 0922  BP: 97/61  Pulse: 66  Temp: 36.7 C  Resp: 16    Complications: No apparent anesthesia complications

## 2014-05-27 ENCOUNTER — Encounter (HOSPITAL_BASED_OUTPATIENT_CLINIC_OR_DEPARTMENT_OTHER): Payer: Self-pay | Admitting: Surgery

## 2014-05-27 NOTE — Addendum Note (Signed)
Addendum  created 05/27/14 1513 by Jewel Baizeimothy D Elis Rawlinson, CRNA   Modules edited: Charges VN

## 2014-10-11 ENCOUNTER — Encounter: Payer: Self-pay | Admitting: Emergency Medicine

## 2014-10-11 ENCOUNTER — Emergency Department
Admission: EM | Admit: 2014-10-11 | Discharge: 2014-10-11 | Disposition: A | Discharge status: Home / Self Care | Payer: BLUE CROSS/BLUE SHIELD | Source: Home / Self Care | Attending: Family Medicine | Admitting: Family Medicine

## 2014-10-11 DIAGNOSIS — R21 Rash and other nonspecific skin eruption: Secondary | ICD-10-CM | POA: Diagnosis not present

## 2014-10-11 MED ORDER — MUPIROCIN CALCIUM 2 % EX CREA
1.0000 | TOPICAL_CREAM | Freq: Three times a day (TID) | CUTANEOUS | Status: DC
Start: 2014-10-11 — End: 2015-11-04

## 2014-10-11 MED ORDER — TRIAMCINOLONE ACETONIDE 0.1 % EX CREA: 1.0000 "application " | TOPICAL_CREAM | Freq: Two times a day (BID) | CUTANEOUS | Status: DC

## 2014-10-11 NOTE — Discharge Instructions (Signed)
May take non-sedating antihistamine such as Zyrtec once daily for itching.   Rash A rash is a change in the color or texture of your skin. There are many different types of rashes. You may have other problems that accompany your rash. CAUSES   Infections.  Allergic reactions. This can include allergies to pets or foods.  Certain medicines.  Exposure to certain chemicals, soaps, or cosmetics.  Heat.  Exposure to poisonous plants.  Tumors, both cancerous and noncancerous. SYMPTOMS   Redness.  Scaly skin.  Itchy skin.  Dry or cracked skin.  Bumps.  Blisters.  Pain. DIAGNOSIS  Your caregiver may do a physical exam to determine what type of rash you have. A skin sample (biopsy) may be taken and examined under a microscope. TREATMENT  Treatment depends on the type of rash you have. Your caregiver may prescribe certain medicines. For serious conditions, you may need to see a skin doctor (dermatologist). HOME CARE INSTRUCTIONS   Avoid the substance that caused your rash.  Do not scratch your rash. This can cause infection.  You may take cool baths to help stop itching.  Only take over-the-counter or prescription medicines as directed by your caregiver.  Keep all follow-up appointments as directed by your caregiver. SEEK IMMEDIATE MEDICAL CARE IF:  You have increasing pain, swelling, or redness.  You have a fever.  You have new or severe symptoms.  You have body aches, diarrhea, or vomiting.  Your rash is not better after 3 days. MAKE SURE YOU:  Understand these instructions.  Will watch your condition.  Will get help right away if you are not doing well or get worse. Document Released: 12/16/2001 Document Revised: 03/20/2011 Document Reviewed: 10/10/2010 [facility] Patient Information 2015 [place], [place]. This information is not intended to replace advice given to you by your health care provider. Make sure you discuss any questions you have with your  health care provider.

## 2014-10-11 NOTE — ED Provider Notes (Signed)
CSN: [id number]     Arrival date & time 10/11/14  1600 History   First MD Initiated Contact with Patient 10/11/14 1616     Chief Complaint  Patient presents with  . Rash     HPI Comments: Patient complains of pruritic rash on her left shoulder area for 2 weeks.  It started as a small pin-point erythematous lesion, becoming larger.  She feels well otherwise.  Patient is a 44 y.o. female presenting with rash. The history is provided by the patient.  Rash Location: left shoulder. Quality: itchiness, redness and swelling   Quality: not blistering, not burning, not draining, not painful, not peeling and not weeping   Severity:  Mild Onset quality:  Gradual Duration:  2 weeks Timing:  Constant Progression:  Worsening Chronicity:  New Context: not animal contact, not chemical exposure, not exposure to similar rash, not food, not hot tub use, not insect bite/sting, not medications, not new detergent/soap, not plant contact, not sick contacts and not sun exposure   Relieved by:  Nothing Worsened by:  Nothing tried Ineffective treatments:  Antibiotic cream Associated symptoms: no abdominal pain, no fatigue, no fever, no headaches, no induration, no joint pain, no myalgias, no nausea, no sore throat and no URI     Past Medical History  Diagnosis Date  . ADD (attention deficit disorder) 09/01/2011  . GERD (gastroesophageal reflux disease) 09/01/2011   Past Surgical History  Procedure Laterality Date  . Cesarean section    . Breast surgery      breast implants bilaterally-   . Breast lumpectomy with radioactive seed localization Right 05/26/2014    Procedure: BREAST LUMPECTOMY WITH RADIOACTIVE SEED LOCALIZATION;  Surgeon: Abigail Miyamoto, MD;  Location: [facility];  Service: General;  Laterality: Right;   Family History  Problem Relation Age of Onset  . Diabetes Mother   . Hyperlipidemia Father   . Stroke Paternal Grandfather   . Diabetes Paternal Grandfather    Social  History  Substance Use Topics  . Smoking status: Never Smoker   . Smokeless tobacco: None  . Alcohol Use: Yes     Comment: Drinks occasiionally on weekends    OB History    No data available     Review of Systems  Constitutional: Negative for fever and fatigue.  HENT: Negative for sore throat.   Gastrointestinal: Negative for nausea and abdominal pain.  Musculoskeletal: Negative for myalgias and arthralgias.  Skin: Positive for rash.  Neurological: Negative for headaches.  All other systems reviewed and are negative.   Allergies  Review of patient's allergies indicates no known allergies.  Home Medications   Prior to Admission medications   Medication Sig Start Date End Date Taking? Authorizing Provider  mupirocin cream (BACTROBAN) 2 % Apply 1 application topically 3 (three) times daily. 10/11/14   Lattie Haw, MD  triamcinolone cream (KENALOG) 0.1 % Apply 1 application topically 2 (two) times daily. 10/11/14   Lattie Haw, MD   Meds Ordered and Administered this Visit  Medications - No data to display  BP 104/70 mmHg  Pulse 59  Temp(Src) 98.3 F (36.8 C) (Oral)  Ht  (1.549 m)  Wt 112 lb (50.803 kg)  BMI 21.17 kg/m2  SpO2 100%  LMP  (Within Weeks) No data found.   Physical Exam  Constitutional: She is oriented to person, place, and time. She appears well-developed and well-nourished. No distress.  HENT:  Head: Normocephalic.  Nose: Nose normal.  Mouth/Throat: Oropharynx is  clear and moist.  Eyes: Conjunctivae are normal. Pupils are equal, round, and reactive to light.  Neck: Neck supple.  Cardiovascular: Normal heart sounds.   Pulmonary/Chest: Breath sounds normal.  Abdominal: There is no tenderness.  Musculoskeletal: She exhibits no edema.  Lymphadenopathy:    She has no cervical adenopathy.  Neurological: She is alert and oriented to person, place, and time.  Skin: Skin is warm and dry. Rash noted.     On the left anterior shoulder area is  6mm by 15mm slightly raised erythematous eruption.  No tenderness to palpation, induration, warmth, or fluctuance.  Nursing note and vitals reviewed.   ED Course  Procedures  none  MDM   1. Rash and nonspecific skin eruption    Begin triamcinolone 0.1%cream BID, and Bactroban cream TID. Followup with dermatologist if not improved about 10 days.    Lattie Haw, MD 10/14/14 1020

## 2014-10-11 NOTE — ED Notes (Signed)
Pt c/o rash on left shoulder for 2 weeks, itching.

## 2014-11-16 ENCOUNTER — Ambulatory Visit (INDEPENDENT_AMBULATORY_CARE_PROVIDER_SITE_OTHER): Payer: BLUE CROSS/BLUE SHIELD | Admitting: Physician Assistant

## 2014-11-16 ENCOUNTER — Encounter: Payer: Self-pay | Admitting: Physician Assistant

## 2014-11-16 VITALS — BP 108/67 | HR 60 | Ht 61.0 in | Wt 113.0 lb

## 2014-11-16 DIAGNOSIS — L299 Pruritus, unspecified: Secondary | ICD-10-CM | POA: Diagnosis not present

## 2014-11-16 DIAGNOSIS — R21 Rash and other nonspecific skin eruption: Secondary | ICD-10-CM | POA: Diagnosis not present

## 2014-11-16 DIAGNOSIS — N926 Irregular menstruation, unspecified: Secondary | ICD-10-CM

## 2014-11-16 DIAGNOSIS — R5383 Other fatigue: Secondary | ICD-10-CM | POA: Diagnosis not present

## 2014-11-16 MED ORDER — CLOBETASOL PROPIONATE 0.05 % EX CREA: 1.0000 "application " | TOPICAL_CREAM | Freq: Two times a day (BID) | CUTANEOUS | Status: DC

## 2014-11-16 NOTE — Progress Notes (Signed)
   Subjective:    Patient ID: Carol Silva, female    DOB: 1970-12-05, 44 y.o.   MRN: [id number]  HPI Pt presents to the clinic with a erythematic pruritic papular rash over left shoulder and spotty under abdomen, and buttocks.  She's had this rash for over a month now. She was seen in urgent care on 10/11/14. He did not note this rash represented as well. He gave her triamcinolone and Bactroban. That has not improved her symptoms. She also complains of some menstrual changes. Her period is coming about a week earlier and lasting for about 1-2 days instead of her regular 3-4 days. She does feel very fatigued. She is wonders if anything can be going on hormonal. No fever, chills. She has started using a new victoria secret lotion.    Review of Systems  All other systems reviewed and are negative.      Objective:   Physical Exam  Constitutional: She is oriented to person, place, and time. She appears well-developed and well-nourished.  HENT:  Head: Normocephalic and atraumatic.  Cardiovascular: Normal rate, regular rhythm and normal heart sounds.   Pulmonary/Chest: Effort normal and breath sounds normal.  Neurological: She is alert and oriented to person, place, and time.  Skin:     Psychiatric: She has a normal mood and affect. Her behavior is normal.          Assessment & Plan:  Rash/itching- stop victoria secret lotion. we'll check CBC and CMP today. We biopsied a papule. We'll call with lab results. Follow-up in one week for suture removal. I did go ahead and give a stronger steroid cream clobetasol to use twice a day as needed for rash. I still suspect this is some type of allergic/contact dermatitis.  Punch Biopsy Procedure Note  Pre-operative Diagnosis: rash   Post-operative Diagnosis: same  Locations:left upper buttock  Indications: itching not responding to steriods  Anesthesia: Lidocaine 1% without epinephrine without added sodium bicarbonate  Procedure Details   History of allergy to iodine: no Patient informed of the risks (including bleeding and infection) and benefits of the  procedure and Verbal informed consent obtained.  The lesion and surrounding area was given a sterile prep using chlorhexidine and draped in the usual sterile fashion. The skin was then stretched perpendicular to the skin tension lines and the lesion removed using the 4mm punch. The resulting ellipse was then closed. The wound was closed with 4-0 Prolene using simple interrupted stitches. Antibiotic ointment and a sterile dressing applied. The specimen was sent for pathologic examination. The patient tolerated the procedure well.  EBL: scant  Condition: Stable  Complications: itching.  Plan: 1. Instructed to keep the wound dry and covered for 24-48h and clean thereafter. 2. Warning signs of infection were reviewed.   3. Recommended that the patient use OTC acetaminophen as needed for pain.  4. Return for suture removal in 1 week.  Menstrual changes/fatigue-we'll check CBC/TSH/TSH. B12/ferritin/CBC/vitamin D was ordered at complete physical on 03/1998 extending. All these were normal.

## 2014-11-16 NOTE — Patient Instructions (Signed)
Try more potent steroid. Sending off biopsy.  Will call with labs.

## 2014-11-17 LAB — CBC WITH DIFFERENTIAL/PLATELET
BASOS ABS: 0 10*3/uL (ref 0.0–0.1)
Basophils Relative: 0 % (ref 0–1)
Eosinophils Absolute: 0.1 10*3/uL (ref 0.0–0.7)
Eosinophils Relative: 2 % (ref 0–5)
HEMATOCRIT: 40.1 % (ref 36.0–46.0)
HEMOGLOBIN: 14 g/dL (ref 12.0–15.0)
LYMPHS ABS: 2.2 10*3/uL (ref 0.7–4.0)
LYMPHS PCT: 42 % (ref 12–46)
MCH: 32.1 pg (ref 26.0–34.0)
MCHC: 34.9 g/dL (ref 30.0–36.0)
MCV: 92 fL (ref 78.0–100.0)
MONOS PCT: 7 % (ref 3–12)
MPV: 9.5 fL (ref 8.6–12.4)
Monocytes Absolute: 0.4 10*3/uL (ref 0.1–1.0)
NEUTROS PCT: 49 % (ref 43–77)
Neutro Abs: 2.6 10*3/uL (ref 1.7–7.7)
Platelets: 293 10*3/uL (ref 150–400)
RBC: 4.36 MIL/uL (ref 3.87–5.11)
RDW: 13.2 % (ref 11.5–15.5)
WBC: 5.3 10*3/uL (ref 4.0–10.5)

## 2014-11-17 LAB — COMPLETE METABOLIC PANEL WITH GFR
ALT: 10 U/L (ref 6–29)
AST: 15 U/L (ref 10–30)
Albumin: 4.4 g/dL (ref 3.6–5.1)
Alkaline Phosphatase: 80 U/L (ref 33–115)
BUN: 9 mg/dL (ref 7–25)
CHLORIDE: 103 mmol/L (ref 98–110)
CO2: 26 mmol/L (ref 20–31)
Calcium: 9.1 mg/dL (ref 8.6–10.2)
Creat: 0.53 mg/dL (ref 0.50–1.10)
GLUCOSE: 93 mg/dL (ref 65–99)
Potassium: 3.9 mmol/L (ref 3.5–5.3)
SODIUM: 140 mmol/L (ref 135–146)
Total Bilirubin: 0.4 mg/dL (ref 0.2–1.2)
Total Protein: 6.9 g/dL (ref 6.1–8.1)

## 2014-11-17 LAB — TSH: TSH: 1.509 u[IU]/mL (ref 0.350–4.500)

## 2014-11-17 LAB — FOLLICLE STIMULATING HORMONE: FSH: 4.4 m[IU]/mL

## 2014-11-23 ENCOUNTER — Ambulatory Visit: Payer: BLUE CROSS/BLUE SHIELD

## 2014-12-18 ENCOUNTER — Ambulatory Visit: Payer: BLUE CROSS/BLUE SHIELD | Admitting: Physician Assistant

## 2015-01-24 ENCOUNTER — Emergency Department
Admission: EM | Admit: 2015-01-24 | Discharge: 2015-01-24 | Disposition: A | Discharge status: Home / Self Care | Payer: BLUE CROSS/BLUE SHIELD | Source: Home / Self Care | Attending: Family Medicine | Admitting: Family Medicine

## 2015-01-24 DIAGNOSIS — H1033 Unspecified acute conjunctivitis, bilateral: Secondary | ICD-10-CM | POA: Diagnosis not present

## 2015-01-24 MED ORDER — POLYMYXIN B-TRIMETHOPRIM 10000-0.1 UNIT/ML-% OP SOLN: 1.0000 [drp] | Freq: Four times a day (QID) | OPHTHALMIC | Status: DC

## 2015-01-24 NOTE — ED Provider Notes (Signed)
CSN: [id number]     Arrival date & time 01/24/15  1101 History   First MD Initiated Contact with Patient 01/24/15 1106     Chief Complaint  Patient presents with  . Eye Problem   (Consider location/radiation/quality/duration/timing/severity/associated sxs/prior Treatment) HPI Pt is a 45yo female presenting to [facility] with c/o bilateral eye irritation, redness, and crusting discharge that started yesterday around 5pm after she had a consultation [facility] at [facility].  Pt states after her 4pm appointment, she noticed eye redness, itching, burning, and discharge.  She did wash the make-up off but the symptoms persisted.  She states this morning her eyes were crusted shut with green discharge. She was able to gentle remove the discharge with a damp cloth. Denies change in vision. She does not wear contacts.  She has worn the same brand of make-up in the past without reaction.  She also reports trying Optivas eye drops she was prescribed for allergies but states that did not help relief symptoms. Denies fever, chills, n/v/d. No recent URI symptoms.  Past Medical History  Diagnosis Date  . ADD (attention deficit disorder) 09/01/2011  . GERD (gastroesophageal reflux disease) 09/01/2011   Past Surgical History  Procedure Laterality Date  . Cesarean section    . Breast surgery      breast implants bilaterally-   . Breast lumpectomy with radioactive seed localization Right 05/26/2014    Procedure: BREAST LUMPECTOMY WITH RADIOACTIVE SEED LOCALIZATION;  Surgeon: Abigail Miyamoto, MD;  [facility];  Service: General;  Laterality: Right;   Family History  Problem Relation Age of Onset  . Diabetes Mother   . Hyperlipidemia Father   . Stroke Paternal Grandfather   . Diabetes Paternal Grandfather    Social History  Substance Use Topics  . Smoking status: Never Smoker   . Smokeless tobacco: None  . Alcohol Use: No     Comment: Drinks occasiionally on weekends     OB History    No data available     Review of Systems  Constitutional: Negative for fever and chills.  HENT: Negative for congestion, ear pain, rhinorrhea, sinus pressure and sore throat.   Eyes: Positive for pain, discharge, redness and itching. Negative for photophobia and visual disturbance.  Respiratory: Negative for cough and shortness of breath.   Gastrointestinal: Negative for nausea, vomiting and diarrhea.  Neurological: Negative for dizziness, light-headedness and headaches.    Allergies  Review of patient's allergies indicates no known allergies.  Home Medications   Prior to Admission medications   Medication Sig Start Date End Date Taking? Authorizing Provider  clobetasol cream (TEMOVATE) 0.05 % Apply 1 application topically 2 (two) times daily. 11/16/14   Jade L Breeback, PA-C  mupirocin cream (BACTROBAN) 2 % Apply 1 application topically 3 (three) times daily. 10/11/14   Lattie Haw, MD  triamcinolone cream (KENALOG) 0.1 % Apply 1 application topically 2 (two) times daily. 10/11/14   Lattie Haw, MD  trimethoprim-polymyxin b (POLYTRIM) ophthalmic solution Place 1 drop into both eyes every 6 (six) hours. For 5 days 01/24/15   Junius Finner, PA-C   Meds Ordered and Administered this Visit  Medications - No data to display  BP 96/63 mmHg  Pulse 69  Temp(Src) 98.2 F (36.8 C) (Oral)  Ht 5\' 1"  (1.549 m)  Wt 118 lb (53.524 kg)  BMI 22.31 kg/m2  SpO2 100%  LMP 01/03/2015 No data found.   Physical Exam  Constitutional: She is oriented to person,  place, and time. She appears well-developed and well-nourished.  HENT:  Head: Normocephalic and atraumatic.  Eyes: EOM are normal. Pupils are equal, round, and reactive to light. Right eye exhibits chemosis ( mild) and discharge. Left eye exhibits chemosis ( mild) and discharge. Right conjunctiva is injected. Left conjunctiva is injected.  Mild erythema to bilateral upper and lower eyelids with scant yellow  discharge.  No periorbital edema or tenderness.  Neck: Normal range of motion.  Cardiovascular: Normal rate.   Pulmonary/Chest: Effort normal.  Musculoskeletal: Normal range of motion.  Neurological: She is alert and oriented to person, place, and time.  Skin: Skin is warm and dry.  Psychiatric: She has a normal mood and affect. Her behavior is normal.  Nursing note and vitals reviewed.   ED Course  Procedures (including critical care time)  Labs Review Labs Reviewed - No data to display  Imaging Review No results found.    MDM   1. Conjunctivitis, acute, bilateral    Pt c/o bilateral eye irritation after make-up consultation yesterday.  Mild erythema and scant yellow discharge.  Mild findings on exam, however, concern for bacterial conjunctivitis given symptoms started after pt had make-up applied from a store.   No evidence of periorbital cellulitis. Rx: polytrim ophthalmic solution for 5 days Home care instructions provided. Encouraged to throw out old makeup and to perform good handwashing.  F/u with PCP in 4-5 days if not improving, sooner if worsening. Patient verbalized understanding and agreement with treatment plan.     Junius Finnerrin O'Malley, PA-C 01/24/15 1137

## 2015-01-24 NOTE — ED Notes (Signed)
[facility] went to [facility] and had a consultation yesterday.  Afterwards, she was having itching, redness, discharge from both eyes.  This am she woke up with both eyes swollen, and greenish discharge.

## 2015-01-24 NOTE — Discharge Instructions (Signed)
Bacterial Conjunctivitis °Bacterial conjunctivitis (commonly called pink eye) is redness, soreness, or puffiness (inflammation) of the white part of your eye. It is caused by a germ called bacteria. These germs can easily spread from person to person (contagious). Your eye often will become red or pink. Your eye may also become irritated, watery, or have a thick discharge.  °HOME CARE  °1. Apply a cool, clean washcloth over closed eyelids. Do this for 10-20 minutes, 3-4 times a day while you have pain. °2. Gently wipe away any fluid coming from the eye with a warm, wet washcloth or cotton ball. °3. Wash your hands often with soap and water. Use paper towels to dry your hands. °4. Do not share towels or washcloths. °5. Change or wash your pillowcase every day. °6. Do not use eye makeup until the infection is gone. °7. Do not use machines or drive if your vision is blurry. °8. Stop using contact lenses. Do not use them again until your doctor says it is okay. °9. Do not touch the tip of the eye drop bottle or medicine tube with your fingers when you put medicine on the eye. °GET HELP RIGHT AWAY IF:  °1. Your eye is not better after 3 days of starting your medicine. °2. You have a yellowish fluid coming out of the eye. °3. You have more pain in the eye. °4. Your eye redness is spreading. °5. Your vision becomes blurry. °6. You have a fever or lasting symptoms for more than 2-3 days. °7. You have a fever and your symptoms suddenly get worse. °8. You have pain in the face. °9. Your face gets red or puffy (swollen). °MAKE SURE YOU:  °· Understand these instructions. °· Will watch this condition. °· Will get help right away if you are not doing well or get worse. °  °This information is not intended to replace advice given to you by your health care provider. Make sure you discuss any questions you have with your health care provider. °  °Document Released: 10/05/2007 Document Revised: 12/13/2011 Document Reviewed:  09/01/2011 °Elsevier Interactive Patient Education ©2016 Elsevier Inc. ° °How to Use Eye Drops and Eye Ointments °HOW TO APPLY EYE DROPS °Follow these steps when applying eye drops: °10. Wash your hands. °11. Tilt your head back. °12. Put a finger under your eye and use it to gently pull your lower lid downward. Keep that finger in place. °13. Using your other hand, hold the dropper between your thumb and index finger. °14. Position the dropper just over the edge of the lower lid. Hold it as close to your eye as you can without touching the dropper to your eye. °15. Steady your hand. One way to do this is to lean your index finger against your brow. °16. Look up. °17. Slowly and gently squeeze one drop of medicine into your eye. °18. Close your eye. °19. Place a finger between your lower eyelid and your nose. Press gently for 2 minutes. This increases the amount of time that the medicine is exposed to the eye. It also reduces side effects that can develop if the drop gets into the bloodstream through the nose. °HOW TO APPLY EYE OINTMENTS °Follow these steps when applying eye ointments: °10. Wash your hands. °11. Put a finger under your eye and use it to gently pull your lower lid downward. Keep that finger in place. °12. Using your other hand, place the tip of the tube between your thumb and index finger with   the remaining fingers braced against your cheek or nose. °13. Hold the tube just over the edge of your lower lid without touching the tube to your lid or eyeball. °14. Look up. °15. Line the inner part of your lower lid with ointment. °16. Gently pull up on your upper lid and look down. This will force the ointment to spread over the surface of the eye. °17. Release the upper lid. °18. If you can, close your eyes for 1-2 minutes. °Do not rub your eyes. If you applied the ointment correctly, your vision will be blurry for a few minutes. This is normal. °ADDITIONAL INFORMATION °· Make sure to use the eye drops or  ointment as told by your health care provider. °· If you have been told to use both eye drops and an eye ointment, apply the eye drops first, then wait 3-4 minutes before you apply the ointment. °· Try not to touch the tip of the dropper or tube to your eye. A dropper or tube that has touched the eye can become contaminated. °  °This information is not intended to replace advice given to you by your health care provider. Make sure you discuss any questions you have with your health care provider. °  °Document Released: 04/03/2000 Document Revised: 05/12/2014 Document Reviewed: 12/22/2013 °Elsevier Interactive Patient Education ©2016 Elsevier Inc. ° °

## 2015-04-26 ENCOUNTER — Ambulatory Visit (INDEPENDENT_AMBULATORY_CARE_PROVIDER_SITE_OTHER): Payer: BLUE CROSS/BLUE SHIELD | Admitting: Family Medicine

## 2015-04-26 ENCOUNTER — Encounter: Payer: Self-pay | Admitting: Family Medicine

## 2015-04-26 VITALS — BP 95/62 | HR 72 | Temp 98.2°F | Wt 119.0 lb

## 2015-04-26 DIAGNOSIS — Z3200 Encounter for pregnancy test, result unknown: Secondary | ICD-10-CM | POA: Diagnosis not present

## 2015-04-26 DIAGNOSIS — R1084 Generalized abdominal pain: Secondary | ICD-10-CM | POA: Diagnosis not present

## 2015-04-26 DIAGNOSIS — R109 Unspecified abdominal pain: Secondary | ICD-10-CM | POA: Insufficient documentation

## 2015-04-26 DIAGNOSIS — H04123 Dry eye syndrome of bilateral lacrimal glands: Secondary | ICD-10-CM | POA: Diagnosis not present

## 2015-04-26 DIAGNOSIS — Z3201 Encounter for pregnancy test, result positive: Secondary | ICD-10-CM | POA: Insufficient documentation

## 2015-04-26 DIAGNOSIS — H527 Unspecified disorder of refraction: Secondary | ICD-10-CM | POA: Diagnosis not present

## 2015-04-26 LAB — WET PREP FOR TRICH, YEAST, CLUE
Clue Cells Wet Prep HPF POC: NONE SEEN
Trich, Wet Prep: NONE SEEN
YEAST WET PREP: NONE SEEN

## 2015-04-26 LAB — POCT URINE PREGNANCY: PREG TEST UR: POSITIVE — AB

## 2015-04-26 NOTE — Progress Notes (Signed)
Carol FragminYesenia Y Silva is a 45 y.o. female [facility] today for abdominal pain, positive home pregnancy test.  1) patient notes a six-month history of right abdominal pain. Pain is typically lower. She describes a cold or chilled sensation that occurs sometimes. The pain tends to radiate into the leg. She notes the pain does not seem to be related with urination defecation or menstruation. She has pain tends to be worse with standing and better with sitting. No vaginal bleeding.  2) positive home pregnancy test. Patient notes 2 positive home pregnancy test recently. Her last menstrual period was March 15. She has had unprotected sex. She notes that she is having regular menstruation. She's been pregnant once in the past with twins 14 years ago.   Past Medical History  Diagnosis Date  . ADD (attention deficit disorder) 09/01/2011  . GERD (gastroesophageal reflux disease) 09/01/2011   Past Surgical History  Procedure Laterality Date  . Cesarean section    . Breast surgery      breast implants bilaterally-   . Breast lumpectomy with radioactive seed localization Right 05/26/2014    Procedure: BREAST LUMPECTOMY WITH RADIOACTIVE SEED LOCALIZATION;  Surgeon: Abigail Miyamotoouglas Blackman, MD;  Location:  [facility];  Service: General;  Laterality: Right;   Social History  Substance Use Topics  . Smoking status: Never Smoker   . Smokeless tobacco: Not on file  . Alcohol Use: No     Comment: Drinks occasiionally on weekends    family history includes Diabetes in her mother and paternal grandfather; Hyperlipidemia in her father; Stroke in her paternal grandfather.  ROS as above Medications: Current Outpatient Prescriptions  Medication Sig Dispense Refill  . clobetasol cream (TEMOVATE) 0.05 % Apply 1 application topically 2 (two) times daily. 60 g 0  . mupirocin cream  (BACTROBAN) 2 % Apply 1 application topically 3 (three) times daily. 15 g 0  . triamcinolone cream (KENALOG) 0.1 % Apply 1 application topically 2 (two) times daily. 30 g 1  . trimethoprim-polymyxin b (POLYTRIM) ophthalmic solution Place 1 drop into both eyes every 6 (six) hours. For 5 days 10 mL 0   No current facility-administered medications for this visit.   No Known Allergies   Exam:  BP 95/62 mmHg  Pulse 72  Temp(Src) 98.2 F (36.8 C) (Oral)  Wt 119 lb (53.978 kg)  SpO2 100%  LMP 03/24/2015 (Exact Date) Gen: Well NAD HEENT: EOMI,  MMM Lungs: Normal work of breathing. CTABL Heart: RRR no MRG Abd: NABS, Soft. Nondistended, Nontender no CVA angle tenderness to percussion Exts: Brisk capillary refill, warm and well perfused.  GYN: Normal-appearing external genitalia. Vaginal canal with thin white discharge. Cervix with small less than 2 mm palpable yellow papule. Normal cervical os without discharge. Bimanual exam with uterus smaller than the pubic bone. No adnexal fullness or masses or tenderness.    Results for orders placed or performed in visit on 04/26/15 (from the past 24 hour(s))  POCT urine pregnancy     Status: Abnormal   Collection Time: 04/26/15  3:00 PM  Result Value Ref Range   Preg Test, Ur Positive (A) Negative   No results found.    45 year old G2 P2 at approximately [redacted] weeks gestational age.  1) abdominal pain: Preceding pregnancy. No bleeding.  Plan for limited workup including CBC and CMP GC chlamydia and wet prep and pelvic ultrasound. Continue to follow.  2) pregnancy: Early pregnancy. Unplanned. Recommend  starting prenatal vitamins. Refer to OB/GYN. Obtain serum hCG HIV and RPR and pelvic US. Discussed abortion options including [facility]. Patient is not sure what she will do.

## 2015-04-26 NOTE — Patient Instructions (Signed)
Thank you for coming in today. Take prenatal vitamins.  Follow up with OBGYN.  If your belly pain worsens, or you have high fever, bad vomiting, blood in your stool or black tarry stool go to the Emergency Room.  Get labs today.  Go to the xray department and schedule the ultrasound You should hear from OB/GYN about your appointment soon.  Abdominal Pain During Pregnancy Abdominal pain is common in pregnancy. Most of the time, it does not cause harm. There are many causes of abdominal pain. Some causes are more serious than others. Some of the causes of abdominal pain in pregnancy are easily diagnosed. Occasionally, the diagnosis takes time to understand. Other times, the cause is not determined. Abdominal pain can be a sign that something is very wrong with the pregnancy, or the pain may have nothing to do with the pregnancy at all. For this reason, always tell your health care provider if you have any abdominal discomfort. HOME CARE INSTRUCTIONS  Monitor your abdominal pain for any changes. The following actions may help to alleviate any discomfort you are experiencing:  Do not have sexual intercourse or put anything in your vagina until your symptoms go away completely.  Get plenty of rest until your pain improves.  Drink clear fluids if you feel nauseous. Avoid solid food as long as you are uncomfortable or nauseous.  Only take over-the-counter or prescription medicine as directed by your health care provider.  Keep all follow-up appointments with your health care provider. SEEK IMMEDIATE MEDICAL CARE IF:  You are bleeding, leaking fluid, or passing tissue from the vagina.  You have increasing pain or cramping.  You have persistent vomiting.  You have painful or bloody urination.  You have a fever.  You notice a decrease in your baby's movements.  You have extreme weakness or feel faint.  You have shortness of breath, with or without abdominal pain.  You develop a severe  headache with abdominal pain.  You have abnormal vaginal discharge with abdominal pain.  You have persistent diarrhea.  You have abdominal pain that continues even after rest, or gets worse. MAKE SURE YOU:   Understand these instructions.  Will watch your condition.  Will get help right away if you are not doing well or get worse.   This information is not intended to replace advice given to you by your health care provider. Make sure you discuss any questions you have with your health care provider.   Document Released: 12/26/2004 Document Revised: 10/16/2012 Document Reviewed: 07/25/2012 Elsevier Interactive Patient Education [employer]2016 Elsevier Inc.

## 2015-04-27 ENCOUNTER — Other Ambulatory Visit: Payer: Self-pay | Admitting: Family Medicine

## 2015-04-27 ENCOUNTER — Ambulatory Visit (INDEPENDENT_AMBULATORY_CARE_PROVIDER_SITE_OTHER): Payer: BLUE CROSS/BLUE SHIELD

## 2015-04-27 DIAGNOSIS — Z3201 Encounter for pregnancy test, result positive: Secondary | ICD-10-CM

## 2015-04-27 DIAGNOSIS — R1084 Generalized abdominal pain: Secondary | ICD-10-CM

## 2015-04-27 DIAGNOSIS — Z3A01 Less than 8 weeks gestation of pregnancy: Secondary | ICD-10-CM

## 2015-04-27 DIAGNOSIS — O26891 Other specified pregnancy related conditions, first trimester: Secondary | ICD-10-CM

## 2015-04-27 LAB — HIV ANTIBODY (ROUTINE TESTING W REFLEX): HIV: NONREACTIVE

## 2015-04-27 LAB — COMPREHENSIVE METABOLIC PANEL
ALBUMIN: 4.1 g/dL (ref 3.6–5.1)
ALT: 10 U/L (ref 6–29)
AST: 17 U/L (ref 10–30)
Alkaline Phosphatase: 74 U/L (ref 33–115)
BUN: 11 mg/dL (ref 7–25)
CHLORIDE: 106 mmol/L (ref 98–110)
CO2: 19 mmol/L — AB (ref 20–31)
CREATININE: 0.56 mg/dL (ref 0.50–1.10)
Calcium: 9 mg/dL (ref 8.6–10.2)
Glucose, Bld: 99 mg/dL (ref 65–99)
Potassium: 3.9 mmol/L (ref 3.5–5.3)
SODIUM: 138 mmol/L (ref 135–146)
Total Bilirubin: 0.4 mg/dL (ref 0.2–1.2)
Total Protein: 6.8 g/dL (ref 6.1–8.1)

## 2015-04-27 LAB — CBC
HCT: 38.6 % (ref 35.0–45.0)
HEMOGLOBIN: 12.9 g/dL (ref 11.7–15.5)
MCH: 31.3 pg (ref 27.0–33.0)
MCHC: 33.4 g/dL (ref 32.0–36.0)
MCV: 93.7 fL (ref 80.0–100.0)
MPV: 10 fL (ref 7.5–12.5)
PLATELETS: 295 10*3/uL (ref 140–400)
RBC: 4.12 MIL/uL (ref 3.80–5.10)
RDW: 13.3 % (ref 11.0–15.0)
WBC: 7.3 10*3/uL (ref 3.8–10.8)

## 2015-04-27 LAB — GC/CHLAMYDIA PROBE AMP
CT PROBE, AMP APTIMA: NOT DETECTED
GC PROBE AMP APTIMA: NOT DETECTED

## 2015-04-27 LAB — HCG, QUANTITATIVE, PREGNANCY: HCG, BETA CHAIN, QUANT, S: 1196.5 m[IU]/mL — AB

## 2015-04-27 LAB — LIPASE: Lipase: 52 U/L (ref 7–60)

## 2015-04-27 LAB — RPR

## 2015-04-27 NOTE — Progress Notes (Signed)
Quick Note:  Blood pregnancy test corresponds to about a 4 week pregnancy. Other labs are normal. ______

## 2015-04-27 NOTE — Progress Notes (Signed)
Quick Note:  Gonorrhea and Chlamydia were negative ______ 

## 2015-04-27 NOTE — Progress Notes (Signed)
Quick Note:  Ultrasound shows intrauterine pregnancy ( normal) at about [redacted] weeks gestational age which makes sense for history. ______

## 2015-05-24 ENCOUNTER — Encounter: Payer: Self-pay | Admitting: Advanced Practice Midwife

## 2015-05-24 ENCOUNTER — Ambulatory Visit (INDEPENDENT_AMBULATORY_CARE_PROVIDER_SITE_OTHER): Payer: BLUE CROSS/BLUE SHIELD | Admitting: Advanced Practice Midwife

## 2015-05-24 ENCOUNTER — Ambulatory Visit (INDEPENDENT_AMBULATORY_CARE_PROVIDER_SITE_OTHER): Payer: BLUE CROSS/BLUE SHIELD

## 2015-05-24 ENCOUNTER — Encounter (HOSPITAL_COMMUNITY)
Admission: AD | Disposition: A | Discharge status: Home / Self Care | Payer: Self-pay | Source: Ambulatory Visit | Attending: Family Medicine

## 2015-05-24 ENCOUNTER — Ambulatory Visit (HOSPITAL_COMMUNITY)
Admission: AD | Admit: 2015-05-24 | Discharge: 2015-05-24 | Disposition: A | Discharge status: Home / Self Care | Payer: BLUE CROSS/BLUE SHIELD | Source: Ambulatory Visit | Attending: Family Medicine | Admitting: Family Medicine

## 2015-05-24 ENCOUNTER — Encounter (HOSPITAL_COMMUNITY): Payer: Self-pay

## 2015-05-24 ENCOUNTER — Ambulatory Visit (HOSPITAL_COMMUNITY): Payer: BLUE CROSS/BLUE SHIELD | Admitting: Anesthesiology

## 2015-05-24 VITALS — BP 96/61 | HR 77 | Wt 121.0 lb

## 2015-05-24 DIAGNOSIS — O039 Complete or unspecified spontaneous abortion without complication: Secondary | ICD-10-CM | POA: Diagnosis present

## 2015-05-24 DIAGNOSIS — K219 Gastro-esophageal reflux disease without esophagitis: Secondary | ICD-10-CM | POA: Insufficient documentation

## 2015-05-24 DIAGNOSIS — R109 Unspecified abdominal pain: Secondary | ICD-10-CM | POA: Diagnosis not present

## 2015-05-24 DIAGNOSIS — Z3A08 8 weeks gestation of pregnancy: Secondary | ICD-10-CM | POA: Insufficient documentation

## 2015-05-24 DIAGNOSIS — O26899 Other specified pregnancy related conditions, unspecified trimester: Secondary | ICD-10-CM

## 2015-05-24 DIAGNOSIS — O021 Missed abortion: Secondary | ICD-10-CM | POA: Insufficient documentation

## 2015-05-24 DIAGNOSIS — O26891 Other specified pregnancy related conditions, first trimester: Secondary | ICD-10-CM | POA: Diagnosis not present

## 2015-05-24 DIAGNOSIS — O09529 Supervision of elderly multigravida, unspecified trimester: Secondary | ICD-10-CM

## 2015-05-24 DIAGNOSIS — Z3A01 Less than 8 weeks gestation of pregnancy: Secondary | ICD-10-CM | POA: Diagnosis not present

## 2015-05-24 DIAGNOSIS — Z3491 Encounter for supervision of normal pregnancy, unspecified, first trimester: Secondary | ICD-10-CM

## 2015-05-24 HISTORY — PX: DILATION AND EVACUATION: SHX1459

## 2015-05-24 LAB — CBC
HCT: 37.8 % (ref 35.0–45.0)
HCT: 34.6 % — ABNORMAL LOW (ref 36.0–46.0)
HEMOGLOBIN: 13.5 g/dL (ref 11.7–15.5)
Hemoglobin: 11.9 g/dL — ABNORMAL LOW (ref 12.0–15.0)
MCH: 32.1 pg (ref 27.0–33.0)
MCH: 31.6 pg (ref 26.0–34.0)
MCHC: 35.7 g/dL (ref 32.0–36.0)
MCHC: 34.4 g/dL (ref 30.0–36.0)
MCV: 89.8 fL (ref 80.0–100.0)
MCV: 91.8 fL (ref 78.0–100.0)
MPV: 9.3 fL (ref 7.5–12.5)
PLATELETS: 294 10*3/uL (ref 140–400)
PLATELETS: 248 10*3/uL (ref 150–400)
RBC: 4.21 MIL/uL (ref 3.80–5.10)
RBC: 3.77 MIL/uL — AB (ref 3.87–5.11)
RDW: 12.7 % (ref 11.0–15.0)
RDW: 12.5 % (ref 11.5–15.5)
WBC: 8.2 10*3/uL (ref 3.8–10.8)
WBC: 7.8 10*3/uL (ref 4.0–10.5)

## 2015-05-24 LAB — ABO/RH: ABO/RH(D): B POS

## 2015-05-24 SURGERY — DILATION AND EVACUATION, UTERUS
Anesthesia: General | Site: Vagina

## 2015-05-24 MED ORDER — MIDAZOLAM HCL 5 MG/5ML IJ SOLN
INTRAMUSCULAR | Status: DC | PRN
  Administered 2015-05-24: 2 mg via INTRAVENOUS

## 2015-05-24 MED ORDER — DOXYCYCLINE HYCLATE 100 MG PO TABS
ORAL_TABLET | ORAL | Status: AC
  Filled 2015-05-24: qty 2

## 2015-05-24 MED ORDER — DOXYCYCLINE HYCLATE 100 MG PO TABS
100.0000 mg | ORAL_TABLET | Freq: Once | ORAL | Status: AC
  Administered 2015-05-24: 100 mg via ORAL

## 2015-05-24 MED ORDER — MIDAZOLAM HCL 2 MG/2ML IJ SOLN
INTRAMUSCULAR | Status: AC
  Filled 2015-05-24: qty 2

## 2015-05-24 MED ORDER — SUCCINYLCHOLINE CHLORIDE 20 MG/ML IJ SOLN
INTRAMUSCULAR | Status: AC
  Filled 2015-05-24: qty 1

## 2015-05-24 MED ORDER — FENTANYL CITRATE (PF) 100 MCG/2ML IJ SOLN
INTRAMUSCULAR | Status: DC | PRN
  Administered 2015-05-24: 100 ug via INTRAVENOUS

## 2015-05-24 MED ORDER — PROPOFOL 500 MG/50ML IV EMUL
INTRAVENOUS | Status: DC | PRN
  Administered 2015-05-24: 125 ug/kg/min via INTRAVENOUS

## 2015-05-24 MED ORDER — LACTATED RINGERS IV SOLN
INTRAVENOUS | Status: DC
  Administered 2015-05-24 (×4): via INTRAVENOUS

## 2015-05-24 MED ORDER — BUPIVACAINE-EPINEPHRINE 0.25% -1:200000 IJ SOLN
INTRAMUSCULAR | Status: DC | PRN
  Administered 2015-05-24: 10 mL

## 2015-05-24 MED ORDER — KETOROLAC TROMETHAMINE 30 MG/ML IJ SOLN
INTRAMUSCULAR | Status: AC
  Filled 2015-05-24: qty 2

## 2015-05-24 MED ORDER — KETOROLAC TROMETHAMINE 30 MG/ML IJ SOLN
INTRAMUSCULAR | Status: DC | PRN
  Administered 2015-05-24: 30 mg via INTRAVENOUS

## 2015-05-24 MED ORDER — OXYCODONE-ACETAMINOPHEN 5-325 MG PO TABS: 1.0000 | ORAL_TABLET | ORAL | Status: DC | PRN

## 2015-05-24 MED ORDER — KETOROLAC TROMETHAMINE 30 MG/ML IJ SOLN
INTRAMUSCULAR | Status: AC
  Filled 2015-05-24: qty 1

## 2015-05-24 MED ORDER — LIDOCAINE HCL (CARDIAC) 20 MG/ML IV SOLN
INTRAVENOUS | Status: DC | PRN
  Administered 2015-05-24: 20 mg via INTRAVENOUS

## 2015-05-24 MED ORDER — LIDOCAINE HCL (CARDIAC) 20 MG/ML IV SOLN
INTRAVENOUS | Status: AC
  Filled 2015-05-24: qty 5

## 2015-05-24 MED ORDER — DOXYCYCLINE HYCLATE 100 MG PO TABS
ORAL_TABLET | ORAL | Status: AC
  Administered 2015-05-24: 100 mg via ORAL
  Filled 2015-05-24: qty 1

## 2015-05-24 MED ORDER — FENTANYL CITRATE (PF) 100 MCG/2ML IJ SOLN
INTRAMUSCULAR | Status: AC
  Filled 2015-05-24: qty 2

## 2015-05-24 MED ORDER — DEXAMETHASONE SODIUM PHOSPHATE 4 MG/ML IJ SOLN
INTRAMUSCULAR | Status: AC
  Filled 2015-05-24: qty 1

## 2015-05-24 MED ORDER — DEXAMETHASONE SODIUM PHOSPHATE 10 MG/ML IJ SOLN
INTRAMUSCULAR | Status: AC
  Filled 2015-05-24: qty 1

## 2015-05-24 MED ORDER — LIDOCAINE HCL (CARDIAC) 20 MG/ML IV SOLN
INTRAVENOUS | Status: AC
  Filled 2015-05-24: qty 10

## 2015-05-24 MED ORDER — HYDROMORPHONE HCL 1 MG/ML IJ SOLN
0.2500 mg | INTRAMUSCULAR | Status: DC | PRN
  Administered 2015-05-24: 0.5 mg via INTRAVENOUS

## 2015-05-24 MED ORDER — DOXYCYCLINE HYCLATE 100 MG PO TABS
200.0000 mg | ORAL_TABLET | Freq: Once | ORAL | Status: AC
Start: 2015-05-24 — End: 2015-05-24
  Administered 2015-05-24: 200 mg via ORAL

## 2015-05-24 MED ORDER — PROPOFOL 10 MG/ML IV BOLUS
INTRAVENOUS | Status: AC
  Filled 2015-05-24: qty 20

## 2015-05-24 MED ORDER — SCOPOLAMINE 1 MG/3DAYS TD PT72
MEDICATED_PATCH | TRANSDERMAL | Status: AC
  Administered 2015-05-24: 1.5 mg via TRANSDERMAL
  Filled 2015-05-24: qty 1

## 2015-05-24 MED ORDER — ONDANSETRON HCL 4 MG/2ML IJ SOLN
INTRAMUSCULAR | Status: AC
  Filled 2015-05-24: qty 2

## 2015-05-24 MED ORDER — SCOPOLAMINE 1 MG/3DAYS TD PT72
1.0000 | MEDICATED_PATCH | Freq: Once | TRANSDERMAL | Status: DC
  Administered 2015-05-24: 1.5 mg via TRANSDERMAL

## 2015-05-24 MED ORDER — HYDROMORPHONE HCL 1 MG/ML IJ SOLN
INTRAMUSCULAR | Status: AC
  Filled 2015-05-24: qty 1

## 2015-05-24 MED ORDER — ONDANSETRON HCL 4 MG/2ML IJ SOLN
INTRAMUSCULAR | Status: DC | PRN
  Administered 2015-05-24: 4 mg via INTRAVENOUS

## 2015-05-24 MED ORDER — DEXAMETHASONE SODIUM PHOSPHATE 10 MG/ML IJ SOLN
INTRAMUSCULAR | Status: DC | PRN
  Administered 2015-05-24: 10 mg via INTRAVENOUS

## 2015-05-24 SURGICAL SUPPLY — 20 items
CATH ROBINSON RED A/P 16FR (CATHETERS) ×2 IMPLANT
CLOTH BEACON ORANGE TIMEOUT ST (SAFETY) ×2 IMPLANT
DECANTER SPIKE VIAL GLASS SM (MISCELLANEOUS) ×2 IMPLANT
GLOVE BIOGEL PI IND STRL 7.0 (GLOVE) ×1 IMPLANT
GLOVE BIOGEL PI IND STRL 7.5 (GLOVE) ×1 IMPLANT
GLOVE BIOGEL PI INDICATOR 7.0 (GLOVE) ×1
GLOVE BIOGEL PI INDICATOR 7.5 (GLOVE) ×1
GLOVE ECLIPSE 7.5 STRL STRAW (GLOVE) ×4 IMPLANT
GOWN STRL REUS W/TWL LRG LVL3 (GOWN DISPOSABLE) ×6 IMPLANT
KIT BERKELEY 1ST TRIMESTER 3/8 (MISCELLANEOUS) ×2 IMPLANT
NS IRRIG 1000ML POUR BTL (IV SOLUTION) ×2 IMPLANT
PACK VAGINAL MINOR WOMEN LF (CUSTOM PROCEDURE TRAY) ×2 IMPLANT
PAD OB MATERNITY 4.3X12.25 (PERSONAL CARE ITEMS) ×2 IMPLANT
PAD PREP 24X48 CUFFED NSTRL (MISCELLANEOUS) ×2 IMPLANT
SET BERKELEY SUCTION TUBING (SUCTIONS) ×2 IMPLANT
TOWEL OR 17X24 6PK STRL BLUE (TOWEL DISPOSABLE) ×4 IMPLANT
VACURETTE 10 RIGID CVD (CANNULA) IMPLANT
VACURETTE 7MM CVD STRL WRAP (CANNULA) ×2 IMPLANT
VACURETTE 8 RIGID CVD (CANNULA) IMPLANT
VACURETTE 9 RIGID CVD (CANNULA) IMPLANT

## 2015-05-24 NOTE — Transfer of Care (Signed)
Immediate Anesthesia Transfer of Care Note  Patient: Carol Silva  Procedure(s) Performed: Procedure(s): DILATATION AND EVACUATION (N/A)  Patient Location: PACU  Anesthesia Type:MAC  Level of Consciousness: awake, alert  and oriented  Airway & Oxygen Therapy: Patient Spontanous Breathing and Patient connected to nasal cannula oxygen  Post-op Assessment: Report given to RN and Post -op Vital signs reviewed and stable  Post vital signs: Reviewed and stable  Last Vitals:  Filed Vitals:   05/24/15 1354  BP: 97/59  Pulse: 70  Temp: 37.3 C  Resp: 20    Last Pain: There were no vitals filed for this visit.    Patients Stated Pain Goal: 3 (05/24/15 1354)  Complications: No apparent anesthesia complications

## 2015-05-24 NOTE — Progress Notes (Signed)
Subjective:     Carol Silva is a 45 y.o. female here for a NOB.  Current complaints: right sided and epigastric abd pain on and off x 1 month. Saw PCP for this problem 04/26/15. Had Transvaginal US showing probable 5.1 week GS. No YS or FP. This was appropriate since pt was 4.5 weeks by LMP. Quant was 1100. No F/U scheduled.   Pt reports continued mild intermittent pain. No bleeding, fever, chills, GI complaints or decrease appetite.  Formal US was ordered today. FP measuring 9 mm (6/6 weeks), no cardiac activity. Radiologist initially did not Dx failed pregnancy, but then addended report to acknowledge that fetus exceeded the cut-off for Dx of failed pregnancy.  US Ob Comp Less 14 Wks  05/24/2015  ADDENDUM REPORT: 05/24/2015 10:39 ADDENDUM: The crown-rump length is 9 mm which is greater than the 7 mm cut off for probable versus definite fetal demise. Fetal demise is felt to be highly likely. This recommendation follows SRU consensus guidelines: Diagnostic Criteria for Nonviable Pregnancy Early in the First Trimester. Malva Limes Med 2013; 098:1191-47. Electronically Signed   By: David  [place] M.D.   On: 05/24/2015 10:39  05/24/2015  CLINICAL DATA:  Abdominal pain, positive pregnancy test EXAM: OBSTETRIC <14 WK [place] AND TRANSVAGINAL OB US TECHNIQUE: Both transabdominal and transvaginal ultrasound examinations were performed for complete evaluation of the gestation as well as the maternal uterus, adnexal regions, and pelvic cul-de-sac. Transvaginal technique was performed to assess early pregnancy. COMPARISON:  Pelvic ultrasound dated April 27, 2015 FINDINGS: Intrauterine gestational sac: Single Yolk sac:  Present Embryo:  Present Cardiac Activity: None visualize Heart Rate: n/a  bpm CRL:  9  mm   6  w   6 d                  [place] EDC: January 11, 2016 Subchorionic hemorrhage:  None visualized. Maternal uterus/adnexae: The right ovary measures 2.7 x 1.5 x 1.3 cm. The left ovary measures 2.8 x 1.8 x 2.6 cm. No  adnexal masses or free fluid are observed. IMPRESSION: 1. Gravid uterus without detectable fetal cardiac activity worrisome for fetal demise. There has not been appropriate interval growth since the ultrasound of April 27, 2015. Correlation with the patient's clinical exam and beta HCG trend is needed. Follow-up ultrasound is available upon request. 2. No subchorionic hemorrhage is observed. 3. Normal appearance of the ovaries and adnexal regions. 4. These results will be called to the ordering clinician or representative by the Radiologist Assistant, and communication documented in the PACS or zVision Dashboard. Electronically Signed: By: David  [place] M.D. On: 05/24/2015 10:11   US Ob Comp Less 14 Wks  04/27/2015  CLINICAL DATA:  Generalized abdominal pain. Positive pregnancy test. EXAM: OBSTETRIC <14 WK [place] AND TRANSVAGINAL OB US TECHNIQUE: Both transabdominal and transvaginal ultrasound examinations were performed for complete evaluation of the gestation as well as the maternal uterus, adnexal regions, and pelvic cul-de-sac. Transvaginal technique was performed to assess early pregnancy. COMPARISON:  None. FINDINGS: Intrauterine gestational sac: Possible very early small gestational sac Yolk sac:  Not visualized Embryo:  Not visualized Cardiac Activity: Not visualized Heart Rate:   bpm MSD: 4  Mm   5 w   1  d CRL:    mm    w    d                  [place] EDC: Subchorionic hemorrhage:  None visualized. Maternal uterus/adnexae: No adnexal masses  or free fluid. IMPRESSION: Possible very early intrauterine gestation, 5 weeks 1 day by mean sac diameter. This could be followed with repeat ultrasound in 2 weeks to ensure expected progression. Electronically Signed   By: Charlett NoseKevin  Dover M.D.   On: 04/27/2015 12:34    Obstetric History OB History  Gravida Para Term Preterm AB SAB TAB Ectopic Multiple Living  2 1 0 1 0 0 0 0 1 2     # Outcome Date GA Lbr Len/2nd Weight Sex Delivery Anes PTL Lv  2 Current           1  Preterm 07/27/00    M [id number]-[id number]   Y     Comments: twins       The following portions of the patient's history were reviewed and updated as appropriate: allergies, current medications, past family history, past medical history, past social history, past surgical history and problem list.  Review of Systems Pertinent items are noted in HPI.    Objective:  BP 96/61 mmHg  Pulse 77  Wt 121 lb (54.885 kg)  LMP 03/24/2015 (Exact Date)  Constitutional: NAD. Healthy-appearing female. Heart: Regular rate Lungs: Regular rate and effort. Abd. : Soft NT. No rebound tenderness or mass.  Pelvic: declined exam.  Assessment:   Missed AB   Plan:  Pt had many questions about previous US and this US. Both were reviewed in detail w/ pt. She was offered repeat US in week multiple times for confirmation of demise, but she declined. Also discussed options for management of incomplete AB including expectant management, Cytotec or D&C. Prefers strongly prefers D&C/D&E at this time. Verbalizes understanding that D&C carries risks of uterine perforation and anesthesia complications not associated w/ expectant management or cytotec and still strongly requests D&C.   Discussed US reports, Hx, Pt preference w/ Dr. Adrian BlackwaterStinson. Will scheduled D&E for this afternoon around 3:45 (when pt will have been NPO x 8 hours.  CBC, ABO/Rh.    Follow up in: 2 weeks. Support given.

## 2015-05-24 NOTE — Discharge Instructions (Addendum)
D&E (Dilation and Evacuation) Dilation and evacuation (D&E) is a minor operation. It involves stretching (dilation) the cervix and evacuation of the uterus. During the procedure, the cervix is dilated and tissue is gently suctioned from the inside of the uterus.  REASONS FOR DOING D&E Removal of retained placenta after giving birth.  Abortion. Miscarriage.  RISKS AND COMPLICATIONS Putting a hole (perforation) in the uterus. Excessive bleeding after the D&E.  Infection of the uterus.  Damage to the cervix.  Developing scar tissue (adhesions) inside the uterus, later causing abnormal bleeding or no monthly bleeding (amenorrhea) or problems with fertility. Complications from general or local anesthetic.     PROCEDURE You may be given a drug to make you sleep (general anesthetic) or a drug that numbs the area (local anesthetic) in and around the cervix.  You will lie on your back with your legs in stirrups.  A curved tool (suction curette) will be used to evacuate the uterus and will then be removed.  This usually takes around 15 to 30 minutes.  AFTER THE PROCEDURE You will rest in the recovery room until you are stable and feel ready to go home.  You may feel sick to your stomach (nauseous) or throw up (vomit) if you had general anesthesia.  You may have light cramping and bleeding for a couple days to 2 weeks after the procedure.  Your uterus needs to make new lining after a D&E. This may make your next period late.   HOME CARE INSTRUCTIONS Do not drive for 24 hours.  Wait 1 week before returning to strenuous activities.  You may resume your usual diet.  Drink enough water and fluids to keep your urine clear or pale yellow.  You should return to your usual bowel function. If constipation occurs, you may:  Take a mild laxative with permission from your caregiver.  Add fruit and bran to your diet.  Take showers instead of baths for two weeks Do not go swimming or use a hot tub until  your caregiver gives you permission.  Have someone with you or available for you the first 24 to 48 hours, especially if you had a general anesthetic.  Do not douche, use tampons, or have intercourse until after your follow-up appointment, or when your caregiver approves.  Only take over-the-counter or prescription medicines for pain, discomfort, or fever as directed by your caregiver. Do not take aspirin. It can cause bleeding.  If a prescription has been given to you, follow your caregiver's directions. You may be given a medicine that kills germs (antibiotic) to prevent an infection.  Keep all your follow-up appointments recommended by your caregiver.   SEEK MEDICAL CARE IF: You have increasing cramps or pain not relieved with medicine.  You develop belly (abdominal) pain, which does not seem to be related to the same area as your earlier cramping and pain.  You feel dizzy or feel like fainting.  You have a bad smelling vaginal discharge.  You develop a rash.  You develop a reaction or allergy to your medicine.   SEEK IMMEDIATE MEDICAL CARE IF: Bleeding is heavier than a normal menstrual period.  You have an oral temperature above 101F, not controlled by medicine.  You develop chest pain.  You develop shortness of breath.  You pass out.  You develop heavy vaginal bleeding with or without blood clots.   MAKE SURE YOU: Understand these instructions.  Will watch your condition.  Will get help right away if you are   shortness of breath.   You pass out.   You develop heavy vaginal bleeding with or without blood clots.   MAKE SURE YOU:  Understand these instructions.   Will watch your condition.   Will get help right away if you are not doing well or get worse.   UPDATED HEALTH PRACTICES  A Pap smear is done to screen for cervical cancer.   The first Pap smear should be done at age 43.   Between ages 2421 and 3729, Pap smears are repeated every 2 years.   Beginning at age 43, you are advised to have a Pap smear every 3 years as long as your past 3 Pap smears have been normal.   Some women have medical problems  that increase the chance of getting cervical cancer. Talk to your caregiver about these problems. It is especially important to talk to your caregiver if a new problem develops soon after your last Pap smear. In these cases, your caregiver may recommend more frequent screening and Pap smears.   The above recommendations are the same for women who have or have not gotten the vaccine for HPV (human papillomavirus).   If you had a uterus removal (hysterectomy) for a problem that was not a cancer or a condition that could lead to cancer, then you no longer need Pap smears.   If you are between ages 6165 and 2270, and you have had normal Pap smears going back 10 years, you no longer need Pap smears.   If you have had past treatment for cervical cancer or a condition that could lead to cancer, you need Pap smears and screening for cancer for at least 20 years after your treatment.   Continue monthly breast self-examinations. Your caregiver can provide information and instructions for breast self-examination.  ExitCare Patient Information 2011 [employer] [place]   DISCHARGE INSTRUCTIONS: D&C / D&E The following instructions have been prepared to help you care for yourself upon your return home.   Personal hygiene:  Use sanitary pads for vaginal drainage, not tampons.  Shower the day after your procedure.  NO tub baths, pools or Jacuzzis for 2-3 weeks.  Wipe front to back after using the bathroom.  Activity and limitations:  Do NOT drive or operate any equipment for 24 hours. The effects of anesthesia are still present and drowsiness may result.  Do NOT rest in bed all day.  Walking is encouraged.  Walk up and down stairs slowly.  You may resume your normal activity in one to two days or as indicated by your physician.  Sexual activity: NO intercourse for at least 2 weeks after the procedure, or as indicated by your physician.  Diet: Eat a light meal as desired this evening. You may resume  your usual diet tomorrow.  Return to work: You may resume your work activities in one to two days or as indicated by your doctor.  What to expect after your surgery: Expect to have vaginal bleeding/discharge for 2-3 days and spotting for up to 10 days. It is not unusual to have soreness for up to 1-2 weeks. You may have a slight burning sensation when you urinate for the first day. Mild cramps may continue for a couple of days. You may have a regular period in 2-6 weeks.  Call your doctor for any of the following:  Excessive vaginal bleeding, saturating and changing one pad every hour.  Inability to urinate 6 hours after discharge from hospital.  Pain not relieved by  pain medication.  Fever of 100.4 F or greater.  Unusual vaginal discharge or odor.   Call for an appointment:    Patients signature: ______________________  Nurses signature ________________________  Support person's signature_______________________    Post Anesthesia Home Care Instructions  No ibuprofen products until: 1 am   Activity: Get plenty of rest for the remainder of the day. A responsible adult should stay with you for 24 hours following the procedure.  For the next 24 hours, DO NOT: -Drive a car -Advertising copywriter -Drink alcoholic beverages -Take any medication unless instructed by your physician -Make any legal decisions or sign important papers.  Meals: Start with liquid foods such as gelatin or soup. Progress to regular foods as tolerated. Avoid greasy, spicy, heavy foods. If nausea and/or vomiting occur, drink only clear liquids until the nausea and/or vomiting subsides. Call your physician if vomiting continues.  Special Instructions/Symptoms: Your throat may feel dry or sore from the anesthesia or the breathing tube placed in your throat during surgery. If this causes discomfort, gargle with warm salt water. The discomfort should disappear within 24 hours.  If you had a scopolamine  patch placed behind your ear for the management of post- operative nausea and/or vomiting:  1. The medication in the patch is effective for 72 hours, after which it should be removed.  Wrap patch in a tissue and discard in the trash. Wash hands thoroughly with soap and water. 2. You may remove the patch earlier than 72 hours if you experience unpleasant side effects which may include dry mouth, dizziness or visual disturbances. 3. Avoid touching the patch. Wash your hands with soap and water after contact with the patch.

## 2015-05-24 NOTE — H&P (Signed)
Preoperative History and Physical  Carol Silva Carol Silva is a 45 Carol.o. [phone] here for surgical management of failed pregnancy.   No significant preoperative concerns.  Proposed surgery: dilation and evacuation.  Past Medical History  Diagnosis Date  . ADD (attention deficit disorder) 09/01/2011  . GERD (gastroesophageal reflux disease) 09/01/2011   Past Surgical History  Procedure Laterality Date  . Cesarean section    . Breast surgery      breast implants bilaterally-   . Breast lumpectomy with radioactive seed localization Right 05/26/2014    Procedure: BREAST LUMPECTOMY WITH RADIOACTIVE SEED LOCALIZATION;  Surgeon: Abigail Miyamotoouglas Blackman, MD;  Location: [facility];  Service: General;  Laterality: Right;   OB History    Gravida Para Term Preterm AB TAB SAB Ectopic Multiple Living   2 1 0 1 0 0 0 0 1 2      Patient denies any cervical dysplasia or STIs. No current facility-administered medications on file prior to encounter.   Current Outpatient Prescriptions on File Prior to Encounter  Medication Sig Dispense Refill  . Prenatal Vit-Fe Fumarate-FA (MULTIVITAMIN-PRENATAL) 27-0.8 MG TABS tablet Take 1 tablet by mouth daily at 12 noon.    Marland Kitchen. azelastine (OPTIVAR) 0.05 % ophthalmic solution Reported on 05/24/2015  11  . clobetasol cream (TEMOVATE) 0.05 % Apply 1 application topically 2 (two) times daily. (Patient not taking: Reported on 05/24/2015) 60 g 0  . mupirocin cream (BACTROBAN) 2 % Apply 1 application topically 3 (three) times daily. (Patient not taking: Reported on 05/24/2015) 15 g 0  . triamcinolone cream (KENALOG) 0.1 % Apply 1 application topically 2 (two) times daily. (Patient not taking: Reported on 05/24/2015) 30 g 1  . trimethoprim-polymyxin b (POLYTRIM) ophthalmic solution Place 1 drop into both eyes every 6 (six) hours. For 5 days (Patient not taking: Reported on 05/24/2015) 10 mL 0   No Known Allergies Social History:   reports that she has never smoked. She has never  used smokeless tobacco. She reports that she drinks alcohol. She reports that she does not use illicit drugs.  Family History  Problem Relation Age of Onset  . Diabetes Mother   . Hyperlipidemia Father   . Stroke Paternal Grandfather   . Diabetes Paternal Grandfather   . Cancer Neg Hx     Review of Systems: Full 10 systems review of systems preformed, which were normal other than what was stated in the HPI.  PHYSICAL EXAM: Blood pressure 97/59, pulse 70, temperature 99.1 F (37.3 C), temperature source Oral, resp. rate 20, height 5\' 1"  (1.549 m), weight 121 lb (54.885 kg), last menstrual period 03/24/2015, SpO2 100 %. General appearance - alert, well appearing, and in no distress Head - Normocephalic, atraumatic.  Right and left external ears normal. Eyes - EOMI.  Nonicteric.  Normal conjunctiva Neck - supple, no lymphadenopathy.  No tracheal deviation Chest - clear to auscultation, no wheezes, rales or rhonchi, symmetric air entry Heart - normal rate and regular rhythm Abdomen - soft, nontender, nondistended, no masses or organomegaly Pelvic - examination not indicated Extremities - peripheral pulses normal, no pedal edema, no clubbing or cyanosis Skin - Warm to touch. no bruises, rashes, wounds. Neuro - Oriented x3.  Cranial nerves intact. Psych - normal thought process.  Judgement intact.  Labs: Results for orders placed or performed in visit on 05/24/15 (from the past 336 hour(s))  CBC   Collection Time: 05/24/15 11:19 AM  Result Value Ref Range   WBC 8.2 3.8 - 10.8 K/uL  RBC 4.21 3.80 - 5.10 MIL/uL   Hemoglobin 13.5 11.7 - 15.5 g/dL   HCT 16.1 09.6 - 04.5 %   MCV 89.8 80.0 - 100.0 fL   MCH 32.1 27.0 - 33.0 pg   MCHC 35.7 32.0 - 36.0 g/dL   RDW 40.9 81.1 - 91.4 %   Platelets 294 140 - 400 K/uL   MPV 9.3 7.5 - 12.5 fL  ABO AND RH    Collection Time: 05/24/15 11:19 AM  Result Value Ref Range   ABO Grouping     Rh Type      Imaging Studies: US Ob Comp Less 14  Wks  05/24/2015  ADDENDUM REPORT: 05/24/2015 10:39 ADDENDUM: The crown-rump length is 9 mm which is greater than the 7 mm cut off for probable versus definite fetal demise. Fetal demise is felt to be highly likely. This recommendation follows SRU consensus guidelines: Diagnostic Criteria for Nonviable Pregnancy Early in the First Trimester. Malva Limes Med 2013; 782:9562-13. Electronically Signed   By: David  [place] M.D.   On: 05/24/2015 10:39  05/24/2015  CLINICAL DATA:  Abdominal pain, positive pregnancy test EXAM: OBSTETRIC <14 WK [place] AND TRANSVAGINAL OB US TECHNIQUE: Both transabdominal and transvaginal ultrasound examinations were performed for complete evaluation of the gestation as well as the maternal uterus, adnexal regions, and pelvic cul-de-sac. Transvaginal technique was performed to assess early pregnancy. COMPARISON:  Pelvic ultrasound dated April 27, 2015 FINDINGS: Intrauterine gestational sac: Single Yolk sac:  Present Embryo:  Present Cardiac Activity: None visualize Heart Rate: n/a  bpm CRL:  9  mm   6  w   6 d                  [place] EDC: January 11, 2016 Subchorionic hemorrhage:  None visualized. Maternal uterus/adnexae: The right ovary measures 2.7 x 1.5 x 1.3 cm. The left ovary measures 2.8 x 1.8 x 2.6 cm. No adnexal masses or free fluid are observed. IMPRESSION: 1. Gravid uterus without detectable fetal cardiac activity worrisome for fetal demise. There has not been appropriate interval growth since the ultrasound of April 27, 2015. Correlation with the patient's clinical exam and beta HCG trend is needed. Follow-up ultrasound is available upon request. 2. No subchorionic hemorrhage is observed. 3. Normal appearance of the ovaries and adnexal regions. 4. These results will be called to the ordering clinician or representative by the Radiologist Assistant, and communication documented in the PACS or zVision Dashboard. Electronically Signed: By: David  [place] M.D. On: 05/24/2015 10:11   US Ob Comp  Less 14 Wks  04/27/2015  CLINICAL DATA:  Generalized abdominal pain. Positive pregnancy test. EXAM: OBSTETRIC <14 WK [place] AND TRANSVAGINAL OB US TECHNIQUE: Both transabdominal and transvaginal ultrasound examinations were performed for complete evaluation of the gestation as well as the maternal uterus, adnexal regions, and pelvic cul-de-sac. Transvaginal technique was performed to assess early pregnancy. COMPARISON:  None. FINDINGS: Intrauterine gestational sac: Possible very early small gestational sac Yolk sac:  Not visualized Embryo:  Not visualized Cardiac Activity: Not visualized Heart Rate:   bpm MSD: 4  Mm   5 w   1  d CRL:    mm    w    d                  [place] EDC: Subchorionic hemorrhage:  None visualized. Maternal uterus/adnexae: No adnexal masses or free fluid. IMPRESSION: Possible very early intrauterine gestation, 5 weeks 1 day by mean sac diameter.  This could be followed with repeat ultrasound in 2 weeks to ensure expected progression. Electronically Signed   By: Charlett Nose M.D.   On: 04/27/2015 12:34   US Ob Transvaginal  05/24/2015  ADDENDUM REPORT: 05/24/2015 10:39 ADDENDUM: The crown-rump length is 9 mm which is greater than the 7 mm cut off for probable versus definite fetal demise. Fetal demise is felt to be highly likely. This recommendation follows SRU consensus guidelines: Diagnostic Criteria for Nonviable Pregnancy Early in the First Trimester. Malva Limes Med 2013; 161:0960-45. Electronically Signed   By: David  [place] M.D.   On: 05/24/2015 10:39  05/24/2015  CLINICAL DATA:  Abdominal pain, positive pregnancy test EXAM: OBSTETRIC <14 WK [place] AND TRANSVAGINAL OB US TECHNIQUE: Both transabdominal and transvaginal ultrasound examinations were performed for complete evaluation of the gestation as well as the maternal uterus, adnexal regions, and pelvic cul-de-sac. Transvaginal technique was performed to assess early pregnancy. COMPARISON:  Pelvic ultrasound dated April 27, 2015 FINDINGS:  Intrauterine gestational sac: Single Yolk sac:  Present Embryo:  Present Cardiac Activity: None visualize Heart Rate: n/a  bpm CRL:  9  mm   6  w   6 d                  [place] EDC: January 11, 2016 Subchorionic hemorrhage:  None visualized. Maternal uterus/adnexae: The right ovary measures 2.7 x 1.5 x 1.3 cm. The left ovary measures 2.8 x 1.8 x 2.6 cm. No adnexal masses or free fluid are observed. IMPRESSION: 1. Gravid uterus without detectable fetal cardiac activity worrisome for fetal demise. There has not been appropriate interval growth since the ultrasound of April 27, 2015. Correlation with the patient's clinical exam and beta HCG trend is needed. Follow-up ultrasound is available upon request. 2. No subchorionic hemorrhage is observed. 3. Normal appearance of the ovaries and adnexal regions. 4. These results will be called to the ordering clinician or representative by the Radiologist Assistant, and communication documented in the PACS or zVision Dashboard. Electronically Signed: By: David  [place] M.D. On: 05/24/2015 10:11   US Ob Transvaginal  04/27/2015  CLINICAL DATA:  Generalized abdominal pain. Positive pregnancy test. EXAM: OBSTETRIC <14 WK [place] AND TRANSVAGINAL OB US TECHNIQUE: Both transabdominal and transvaginal ultrasound examinations were performed for complete evaluation of the gestation as well as the maternal uterus, adnexal regions, and pelvic cul-de-sac. Transvaginal technique was performed to assess early pregnancy. COMPARISON:  None. FINDINGS: Intrauterine gestational sac: Possible very early small gestational sac Yolk sac:  Not visualized Embryo:  Not visualized Cardiac Activity: Not visualized Heart Rate:   bpm MSD: 4  Mm   5 w   1  d CRL:    mm    w    d                  [place] EDC: Subchorionic hemorrhage:  None visualized. Maternal uterus/adnexae: No adnexal masses or free fluid. IMPRESSION: Possible very early intrauterine gestation, 5 weeks 1 day by mean sac diameter. This could be followed  with repeat ultrasound in 2 weeks to ensure expected progression. Electronically Signed   By: Charlett Nose M.D.   On: 04/27/2015 12:34    Assessment: Patient Active Problem List   Diagnosis Date Noted  . Abdominal pain 04/26/2015  . Positive pregnancy test 04/26/2015  . Allergic conjunctivitis 04/06/2014  . Breast mass, right 04/06/2014  . Heavy period 01/19/2013  . Acne 01/19/2013  . Left breast mass 12/19/2012  .  Right knee pain 07/05/2012  . GERD (gastroesophageal reflux disease) 09/01/2011  . ADD (attention deficit disorder) 09/01/2011    Plan: Patient will undergo surgical management with dilation and evacuation.   The risks of surgery were discussed in detail with the patient including but not limited to: bleeding which may require transfusion or reoperation; infection which may require antibiotics; injury to surrounding organs which may involve bowel, bladder, ureters ; need for additional procedures including laparoscopy or laparotomy; thromboembolic phenomenon, surgical site problems and other postoperative/anesthesia complications. Likelihood of success in alleviating the patient's condition was discussed. Routine postoperative instructions will be reviewed with the patient and her family in detail after surgery.  The patient concurred with the proposed plan, giving informed written consent for the surgery.  Patient has been NPO since last night she will remain NPO for procedure.  Anesthesia and OR aware.  Preoperative prophylactic antibiotics and SCDs ordered on call to the OR.  To OR when ready.  Levie Heritage, DO  05/24/2015, 2:10 PM

## 2015-05-24 NOTE — Anesthesia Postprocedure Evaluation (Signed)
Anesthesia Post Note  Patient: Carol Silva  Procedure(s) Performed: Procedure(s) (LRB): DILATATION AND EVACUATION (N/A)  Patient location during evaluation: PACU Anesthesia Type: MAC Level of consciousness: awake and alert and oriented Pain management: pain level controlled Vital Signs Assessment: post-procedure vital signs reviewed and stable Respiratory status: spontaneous breathing, nonlabored ventilation and respiratory function stable Cardiovascular status: blood pressure returned to baseline and stable Postop Assessment: no signs of nausea or vomiting Anesthetic complications: no     Last Vitals:  Filed Vitals:   05/24/15 1354  BP: 97/59  Pulse: 70  Temp: 37.3 C  Resp: 20    Last Pain: There were no vitals filed for this visit. Pain Goal: Patients Stated Pain Goal: 3 (05/24/15 1354)               [name] A.

## 2015-05-24 NOTE — Anesthesia Preprocedure Evaluation (Addendum)
Anesthesia Evaluation  Patient identified by MRN, date of birth, ID band Patient awake    Reviewed: Allergy & Precautions, NPO status , Patient's Chart, lab work & pertinent test results  History of Anesthesia Complications Negative for: history of anesthetic complications  Airway Mallampati: II  TM Distance: >3 FB Neck ROM: Full    Dental  (+) Teeth Intact, Dental Advisory Given   Pulmonary neg pulmonary ROS,    Pulmonary exam normal        Cardiovascular negative cardio ROS Normal cardiovascular exam     Neuro/Psych ADD Neuromuscular disease    GI/Hepatic Neg liver ROS, GERD  Medicated,  Endo/Other  negative endocrine ROS  Renal/GU negative Renal ROS     Musculoskeletal   Abdominal   Peds  Hematology   Anesthesia Other Findings   Reproductive/Obstetrics                            Anesthesia Physical  Anesthesia Plan  ASA: II  Anesthesia Plan: MAC   Post-op Pain Management:    Induction: Intravenous  Airway Management Planned: Natural Airway and Nasal Cannula  Additional Equipment:   Intra-op Plan:   Post-operative Plan:   Informed Consent: I have reviewed the patients History and Physical, chart, labs and discussed the procedure including the risks, benefits and alternatives for the proposed anesthesia with the patient or authorized representative who has indicated his/her understanding and acceptance.   Dental advisory given  Plan Discussed with: CRNA, Anesthesiologist and Surgeon  Anesthesia Plan Comments:        Anesthesia Quick Evaluation

## 2015-05-24 NOTE — Op Note (Signed)
Carol FragminYesenia Y Tillett PROCEDURE DATE: 05/24/2015  PREOPERATIVE DIAGNOSIS: 8 week missed abortion. POSTOPERATIVE DIAGNOSIS: The same. PROCEDURE:     Dilation and Evacuation. SURGEON:  Dr. Adrian BlackwaterStinson  INDICATIONS: 45 y.o. G2P0102with MAB at [redacted] weeks gestation, needing surgical completion.  Risks of surgery were discussed with the patient including but not limited to: bleeding which may require transfusion; infection which may require antibiotics; injury to uterus or surrounding organs;need for additional procedures including laparotomy or laparoscopy; possibility of intrauterine scarring which may impair future fertility; and other postoperative/anesthesia complications. Written informed consent was obtained.    FINDINGS:  A 6 size anteverted/midline/retroverted uterus, moderate amounts of products of conception, specimen sent to pathology.  ANESTHESIA:    Monitored intravenous sedation, paracervical block. INTRAVENOUS FLUIDS:  1000 ml of LR ESTIMATED BLOOD LOSS:  Less than 20 ml. SPECIMENS:  Products of conception sent to pathology COMPLICATIONS:  None immediate.  PROCEDURE DETAILS:  The patient received intravenous antibiotics while in the preoperative area.  She was then taken to the operating room where general anesthesia was administered and was found to be adequate.  After an adequate timeout was performed, she was placed in the dorsal lithotomy position and examined; then prepped and draped in the sterile manner.   Her bladder was catheterized for an unmeasured amount of clear, yellow urine. A vaginal speculum was then placed in the patient's vagina and a paracervical block using 1% Marcaine was administered.  A single tooth tenaculum was then applied to the anterior lip of the cervix. The cervix was gently dilated to accommodate a 7 mm suction curette that was gently advanced to the uterine fundus.  The suction device was then activated and curette slowly rotated to clear the uterus of products of  conception.  A sharp curettage was then performed to confirm completer emptying of the uterus.There was minimal bleeding noted and the tenaculum removed with good hemostasis noted.  The patient tolerated the procedure well.  The patient was taken to the recovery area in stable condition.   Levie HeritageJacob J Dontee Jaso, DO 05/24/2015 7:16 PM

## 2015-05-25 ENCOUNTER — Encounter (HOSPITAL_COMMUNITY): Payer: Self-pay | Admitting: Family Medicine

## 2015-05-25 DIAGNOSIS — O021 Missed abortion: Secondary | ICD-10-CM | POA: Diagnosis not present

## 2015-05-25 LAB — ABO AND RH: RH TYPE: POSITIVE

## 2015-06-15 ENCOUNTER — Encounter: Payer: Self-pay | Admitting: Obstetrics & Gynecology

## 2015-06-15 ENCOUNTER — Ambulatory Visit (INDEPENDENT_AMBULATORY_CARE_PROVIDER_SITE_OTHER): Payer: BLUE CROSS/BLUE SHIELD | Admitting: Obstetrics & Gynecology

## 2015-06-15 VITALS — BP 109/72 | HR 69 | Ht 61.0 in | Wt 119.0 lb

## 2015-06-15 DIAGNOSIS — Z9889 Other specified postprocedural states: Secondary | ICD-10-CM

## 2015-06-15 DIAGNOSIS — Z30011 Encounter for initial prescription of contraceptive pills: Secondary | ICD-10-CM | POA: Diagnosis not present

## 2015-06-15 MED ORDER — NORGESTREL-ETHINYL ESTRADIOL 0.3-30 MG-MCG PO TABS: 1.0000 | ORAL_TABLET | Freq: Every day | ORAL | Status: DC

## 2015-06-15 NOTE — Progress Notes (Signed)
   Subjective:    Patient ID: Carol FragminYesenia Y Silva, female    DOB: 12/28/1970, 45 y.o.   MRN: [id number]  HPI  45 yo MH P2 (twin 43 yo sons) here for discussion of contraception after a d&c for miscarriage. She has been having sex since then, using withdrawal. She used Copper T in the distant past and had very heavy periods. She doesn't think that she wants more kids/pregnancies.  Review of Systems Pap normal 3/16    Objective:   Physical Exam  [id number] Breathing, conversing, and ambulating normally Abd- benign      Assessment & Plan:  S/P d&c- doing well Contraception- discussed all options She wants to try OCPs, rec back up for 4 weeks (discussed risks of stroke, DVT)

## 2015-11-04 ENCOUNTER — Emergency Department
Admission: EM | Admit: 2015-11-04 | Discharge: 2015-11-04 | Disposition: A | Discharge status: Home / Self Care | Payer: BLUE CROSS/BLUE SHIELD | Source: Home / Self Care | Attending: Family Medicine | Admitting: Family Medicine

## 2015-11-04 ENCOUNTER — Encounter: Payer: Self-pay | Admitting: Emergency Medicine

## 2015-11-04 ENCOUNTER — Emergency Department (INDEPENDENT_AMBULATORY_CARE_PROVIDER_SITE_OTHER): Payer: BLUE CROSS/BLUE SHIELD

## 2015-11-04 DIAGNOSIS — R10A0 Flank pain, unspecified side: Secondary | ICD-10-CM

## 2015-11-04 DIAGNOSIS — R10A1 Flank pain, right side: Secondary | ICD-10-CM

## 2015-11-04 DIAGNOSIS — N83201 Unspecified ovarian cyst, right side: Secondary | ICD-10-CM

## 2015-11-04 DIAGNOSIS — M546 Pain in thoracic spine: Secondary | ICD-10-CM

## 2015-11-04 DIAGNOSIS — R10813 Right lower quadrant abdominal tenderness: Secondary | ICD-10-CM

## 2015-11-04 DIAGNOSIS — R109 Unspecified abdominal pain: Secondary | ICD-10-CM | POA: Diagnosis not present

## 2015-11-04 DIAGNOSIS — R6881 Early satiety: Secondary | ICD-10-CM

## 2015-11-04 DIAGNOSIS — R110 Nausea: Secondary | ICD-10-CM

## 2015-11-04 LAB — POCT URINALYSIS DIP (MANUAL ENTRY)
Bilirubin, UA: NEGATIVE
Glucose, UA: NEGATIVE
Ketones, POC UA: NEGATIVE
Leukocytes, UA: NEGATIVE
Nitrite, UA: NEGATIVE
Protein Ur, POC: NEGATIVE
Spec Grav, UA: 1.02 (ref 1.005–1.03)
Urobilinogen, UA: 1 (ref 0–1)
pH, UA: 6.5 (ref 5–8)

## 2015-11-04 LAB — POCT URINE PREGNANCY: Preg Test, Ur: NEGATIVE

## 2015-11-04 MED ORDER — TRAMADOL HCL 50 MG PO TABS: 50.0000 mg | ORAL_TABLET | Freq: Four times a day (QID) | ORAL | 0 refills | Status: DC | PRN

## 2015-11-04 MED ORDER — CYCLOBENZAPRINE HCL 10 MG PO TABS: 10.0000 mg | ORAL_TABLET | Freq: Two times a day (BID) | ORAL | 0 refills | Status: DC | PRN

## 2015-11-04 MED ORDER — IOPAMIDOL (ISOVUE-300) INJECTION 61%
100.0000 mL | Freq: Once | INTRAVENOUS | Status: AC | PRN
  Administered 2015-11-04: 100 mL via INTRAVENOUS

## 2015-11-04 MED ORDER — IBUPROFEN 600 MG PO TABS: 600.0000 mg | ORAL_TABLET | Freq: Four times a day (QID) | ORAL | 0 refills | Status: DC | PRN

## 2015-11-04 MED ORDER — ONDANSETRON HCL 4 MG PO TABS: 4.0000 mg | ORAL_TABLET | Freq: Four times a day (QID) | ORAL | 0 refills | Status: DC

## 2015-11-04 NOTE — ED Provider Notes (Signed)
CSN: [id number]     Arrival date & time 11/04/15  1250 History   First MD Initiated Contact with Patient 11/04/15 1322     Chief Complaint  Patient presents with  . Flank Pain   (Consider location/radiation/quality/duration/timing/severity/associated sxs/prior Treatment) HPI  Carol Silva is a 45 y.o. female presenting to UC with c/o Right flank pain that started initially 6 months ago, eventually went away but came back about 5 days ago.  Pain is aching and burning in nature, radiating to Right side of abdomen as well as down Right leg.  Associated dizziness, nausea, fatigue, loss of appetite as she feels full after only eating a small amount.  She has not tried anything for current pain.  Denies urinary or vaginal symptoms. Denies hx of abdominal surgeries, however, past surgical hx significant for c-section and dilation and evacuation 05/24/15.    Past Medical History:  Diagnosis Date  . ADD (attention deficit disorder) 09/01/2011  . GERD (gastroesophageal reflux disease) 09/01/2011   Past Surgical History:  Procedure Laterality Date  . BREAST LUMPECTOMY WITH RADIOACTIVE SEED LOCALIZATION Right 05/26/2014   Procedure: BREAST LUMPECTOMY WITH RADIOACTIVE SEED LOCALIZATION;  Surgeon: Abigail Miyamotoouglas Blackman, MD;  Location: [facility];  Service: General;  Laterality: Right;  . BREAST SURGERY     breast implants bilaterally-   . CESAREAN SECTION    . DILATION AND EVACUATION N/A 05/24/2015   Procedure: DILATATION AND EVACUATION;  Surgeon: Levie HeritageJacob J Stinson, DO;  Location: [facility];  Service: Gynecology;  Laterality: N/A;   Family History  Problem Relation Age of Onset  . Diabetes Mother   . Hyperlipidemia Father   . Stroke Paternal Grandfather   . Diabetes Paternal Grandfather   . Cancer Neg Hx    Social History  Substance Use Topics  . Smoking status: Never Smoker  . Smokeless tobacco: Never Used  . Alcohol use Yes     Comment: Drinks occasiionally on weekends    OB History     Gravida Para Term Preterm AB Living   2 1 0 1 0 2   SAB TAB Ectopic Multiple Live Births   0 0 0 1 1     Review of Systems  Constitutional: Positive for chills and fatigue. Negative for fever.  HENT: Negative for congestion and sore throat.   Respiratory: Negative for cough, shortness of breath and wheezing.   Cardiovascular: Negative for chest pain, palpitations and leg swelling.  Gastrointestinal: Positive for abdominal pain and nausea. Negative for diarrhea and vomiting.  Genitourinary: Positive for flank pain (Right). Negative for dysuria, frequency, hematuria, menstrual problem, pelvic pain, urgency, vaginal bleeding and vaginal discharge.  Musculoskeletal: Positive for back pain (Right mid to lower). Negative for arthralgias, gait problem, joint swelling, myalgias, neck pain and neck stiffness.  Skin: Negative for color change and rash.  Neurological: Negative for weakness and numbness.    Allergies  Review of patient's allergies indicates no known allergies.  Home Medications   Prior to Admission medications   Medication Sig Start Date End Date Taking? Authorizing Provider  cyclobenzaprine (FLEXERIL) 10 MG tablet Take 1 tablet (10 mg total) by mouth 2 (two) times daily as needed. 11/04/15   Junius FinnerErin O'Malley, PA-C  ibuprofen (ADVIL,MOTRIN) 600 MG tablet Take 1 tablet (600 mg total) by mouth every 6 (six) hours as needed. 11/04/15   Junius FinnerErin O'Malley, PA-C  ondansetron (ZOFRAN) 4 MG tablet Take 1 tablet (4 mg total) by mouth every 6 (six) hours. 11/04/15   Denny PeonErin  Gershon Mussel, PA-C  oxyCODONE-acetaminophen (ROXICET) 5-325 MG tablet Take 1-2 tablets by mouth every 4 (four) hours as needed for severe pain. Patient not taking: Reported on 06/15/2015 05/24/15   Levie Heritage, DO  Prenatal Vit-Fe Fumarate-FA (MULTIVITAMIN-PRENATAL) 27-0.8 MG TABS tablet Take 1 tablet by mouth daily at 12 noon. Reported on 06/15/2015    Historical Provider, MD  traMADol (ULTRAM) 50 MG tablet Take 1 tablet (50 mg  total) by mouth every 6 (six) hours as needed. 11/04/15   Junius Finner, PA-C  triamcinolone cream (KENALOG) 0.1 % Apply 1 application topically 2 (two) times daily. Patient not taking: Reported on 05/24/2015 10/11/14   Lattie Haw, MD   Meds Ordered and Administered this Visit  Medications - No data to display  BP 96/63 (BP Location: Left Arm)   Pulse 67   Temp 98 F (36.7 C) (Oral)   Ht 5\' 1"  (1.549 m)   Wt 120 lb (54.4 kg)   LMP 10/21/2015   SpO2 98%   Breastfeeding? Unknown   BMI 22.67 kg/m  No data found.   Physical Exam  Constitutional: She appears well-developed and well-nourished. No distress.  HENT:  Head: Normocephalic and atraumatic.  Eyes: Conjunctivae are normal. No scleral icterus.  Neck: Normal range of motion.  Cardiovascular: Normal rate, regular rhythm and normal heart sounds.   Pulmonary/Chest: Effort normal and breath sounds normal. No respiratory distress. She has no wheezes. She has no rales. She exhibits no tenderness.  Abdominal: Soft. Bowel sounds are normal. She exhibits no distension and no mass. There is tenderness. There is guarding and CVA tenderness (Right vs muscular tenderness). There is no rebound.    Musculoskeletal: Normal range of motion. She exhibits tenderness. She exhibits no edema.  No midline spinal tenderness. Tenderness to Right lower thoracic muscles and Right lumbar muscles. Negative straight leg raise.  Neurological: She is alert.  Skin: Skin is warm and dry. Capillary refill takes less than 2 seconds. No rash noted. She is not diaphoretic. No erythema.  Nursing note and vitals reviewed.   Urgent Care Course   Clinical Course    Procedures (including critical care time)  Labs Review Labs Reviewed  POCT URINALYSIS DIP (MANUAL ENTRY) - Abnormal; Notable for the following:       Result Value   Blood, UA trace-lysed (*)    All other components within normal limits  COMPLETE METABOLIC PANEL WITH GFR  POCT URINE  PREGNANCY  POCT CBC W AUTO DIFF ([facility])    Imaging Review Ct Abdomen Pelvis W Contrast  Result Date: 11/04/2015 CLINICAL DATA:  Right lower quadrant pain and right flank pain off and on for 6 months EXAM: CT ABDOMEN AND PELVIS WITH CONTRAST TECHNIQUE: Multidetector CT imaging of the abdomen and pelvis was performed using the standard protocol following bolus administration of intravenous contrast. CONTRAST:  ISOVUE-300 IOPAMIDOL (ISOVUE-300) INJECTION 61% COMPARISON:  None. FINDINGS: Lower chest: No acute abnormality. Hepatobiliary: Liver is diffusely fatty infiltrated. A few small less than 1 cm hypodensities are noted likely representing cysts. Gallbladder is within normal limits. Pancreas: Unremarkable. No pancreatic ductal dilatation or surrounding inflammatory changes. Spleen: Normal in size without focal abnormality. Adrenals/Urinary Tract: Adrenal glands are unremarkable. Kidneys are normal, without renal calculi, focal lesion, or hydronephrosis. Bladder is unremarkable. Stomach/Bowel: Stomach is within normal limits. Appendix appears normal. No evidence of bowel wall thickening, distention, or inflammatory changes. Vascular/Lymphatic: No significant vascular findings are present. No enlarged abdominal or pelvic lymph nodes. Reproductive: Uterus  is within normal limits through 2.3 cm right ovarian cyst is seen. Follicular changes are noted within the left ovary. Other: No abdominal wall hernia or abnormality. No abdominopelvic ascites. Musculoskeletal: No acute or significant osseous findings. IMPRESSION: Right ovarian cyst which may be related to the patient's clinical symptomatology. Normal-appearing appendix. Chronic changes as described. Electronically Signed   By: Alcide Clever M.D.   On: 11/04/2015 15:46     MDM   1. Flank pain   2. Early satiety   3. Nausea   4. Right flank pain   5. RLQ abdominal tenderness   6. Acute right-sided thoracic back pain   7. Right  ovarian cyst    Pt c/o Right flank pain that has worsened over the last 1 week. Pt is tender to thoracic and lumbar muscles on Right side but also to Right lower abdomen.  Pt c/o loss of appetite, nausea and early satiety.  Concern for possible appendicitis vs other acute abnormality causing current symptoms.  CT abd/pelvis: No acute findings including No evidence of appendicitis or cholecystitis.  Right ovarian cyst 2.3cm may be cause of pt's current symptoms.   Discussed imaging with pt. Reassured no acute findings.  Back pain and early satiety may be unrelated.  CBC- unremarkable CMP-Pending, will notify pt of results when they come back.  Will treat pt symptomatically for now and have her f/u with PCP and OB/GYN for further evaluation and treatment of symptoms as pt may benefit from having ovarian cyst removed.  Rx: Zofran, tramadol, ibuprofen and flexeril    Junius Finner, PA-C 11/04/15 1721

## 2015-11-04 NOTE — ED Triage Notes (Signed)
Rt flank pain first time about 6 months, started again this Sat, worse today. Radiates down leg and around to back. Feels dizzy, nauseated, sleepy, no appetite, feels full after eating only a little food.

## 2015-11-04 NOTE — Discharge Instructions (Signed)
Flexeril is a muscle relaxer and may cause drowsiness. Do not drink alcohol, drive, or operate heavy machinery while taking. ° °Tramadol is strong pain medication. While taking, do not drink alcohol, drive, or perform any other activities that requires focus while taking these medications.  ° ° °

## 2015-11-05 ENCOUNTER — Telehealth: Payer: Self-pay | Admitting: Emergency Medicine

## 2015-11-05 LAB — POCT CBC W AUTO DIFF (K'VILLE URGENT CARE)

## 2015-11-05 LAB — COMPLETE METABOLIC PANEL WITH GFR
ALT: 12 U/L (ref 6–29)
AST: 15 U/L (ref 10–30)
Albumin: 4.2 g/dL (ref 3.6–5.1)
Alkaline Phosphatase: 67 U/L (ref 33–115)
BUN: 14 mg/dL (ref 7–25)
CO2: 24 mmol/L (ref 20–31)
Calcium: 9 mg/dL (ref 8.6–10.2)
Chloride: 105 mmol/L (ref 98–110)
Creat: 0.65 mg/dL (ref 0.50–1.10)
GFR, Est African American: >89 mL/min (ref >=60)
GFR, Est Non African American: >89 mL/min (ref >=60)
Glucose, Bld: 91 mg/dL (ref 65–99)
Potassium: 4.1 mmol/L (ref 3.5–5.3)
Sodium: 137 mmol/L (ref 135–146)
Total Bilirubin: 0.6 mg/dL (ref 0.2–1.2)
Total Protein: 6.8 g/dL (ref 6.1–8.1)

## 2015-11-05 NOTE — Telephone Encounter (Signed)
Patient states pain is slightly improved; she did go to Gyn following her visit here; I told her blood lab test was wnl.

## 2015-11-08 ENCOUNTER — Encounter: Payer: Self-pay | Admitting: Obstetrics & Gynecology

## 2015-11-08 ENCOUNTER — Ambulatory Visit (INDEPENDENT_AMBULATORY_CARE_PROVIDER_SITE_OTHER): Payer: BLUE CROSS/BLUE SHIELD | Admitting: Obstetrics & Gynecology

## 2015-11-08 VITALS — BP 92/54 | HR 64 | Ht 61.0 in | Wt 120.0 lb

## 2015-11-08 DIAGNOSIS — N83201 Unspecified ovarian cyst, right side: Secondary | ICD-10-CM

## 2015-11-08 DIAGNOSIS — Z3002 Counseling and instruction in natural family planning to avoid pregnancy: Secondary | ICD-10-CM | POA: Diagnosis not present

## 2015-11-08 DIAGNOSIS — R6882 Decreased libido: Secondary | ICD-10-CM | POA: Diagnosis not present

## 2015-11-08 NOTE — Progress Notes (Signed)
   Subjective:    Patient ID: Carol Silva, female    DOB: Jul 30, 1970, 45 y.o.   MRN: [id number]  HPI 45 yo MP2 93(43 yo twin sons) here today to discuss her 2.3 cm right ovarian cyst diagnosed via CT last week when seen at urgent care. She tried OCPs a few months ago but felt "weird" and didn't have periods, so she stopped them. She is using withdrawal for contraception.  Her other complaint is decreased libido for about 8 months  She also complains of a 1 year h/o moodiness and crying for about 2 weeks prior to each period.   Review of Systems     Objective:   Physical Exam [id number] Breathing, conversing, and ambulating normally Abd- benign       Assessment & Plan:  Ovarian cyst- discussed the normal cyst cycle. Reassurance given Contraception with desire for normal monthly period- offered vas versus BTL Moodiness prior to periods- offered generic prozac (is aware of sexual dysfunction)- She declines this.

## 2015-12-01 ENCOUNTER — Ambulatory Visit: Payer: BLUE CROSS/BLUE SHIELD | Admitting: Physician Assistant

## 2015-12-05 ENCOUNTER — Emergency Department
Admission: EM | Admit: 2015-12-05 | Discharge: 2015-12-05 | Disposition: A | Discharge status: Home / Self Care | Payer: BLUE CROSS/BLUE SHIELD | Source: Home / Self Care | Attending: Family Medicine | Admitting: Family Medicine

## 2015-12-05 ENCOUNTER — Encounter: Payer: Self-pay | Admitting: Emergency Medicine

## 2015-12-05 DIAGNOSIS — N3090 Cystitis, unspecified without hematuria: Secondary | ICD-10-CM

## 2015-12-05 DIAGNOSIS — N390 Urinary tract infection, site not specified: Secondary | ICD-10-CM | POA: Diagnosis not present

## 2015-12-05 LAB — POCT URINALYSIS DIP (MANUAL ENTRY)
BILIRUBIN UA: NEGATIVE
Bilirubin, UA: NEGATIVE
Glucose, UA: NEGATIVE
Nitrite, UA: NEGATIVE
PH UA: 6.5
PROTEIN UA: NEGATIVE
RBC UA: NEGATIVE
SPEC GRAV UA: 1.015
UROBILINOGEN UA: 0.2

## 2015-12-05 LAB — POCT URINE PREGNANCY: PREG TEST UR: NEGATIVE

## 2015-12-05 MED ORDER — NITROFURANTOIN MONOHYD MACRO 100 MG PO CAPS: 100.0000 mg | ORAL_CAPSULE | Freq: Two times a day (BID) | ORAL | 0 refills | Status: DC

## 2015-12-05 NOTE — Discharge Instructions (Signed)
Increase fluid intake. May use non-prescription AZO for about two days, if desired, to decrease urinary discomfort.  If symptoms become significantly worse during the night or over the weekend, proceed to the local emergency room.  

## 2015-12-05 NOTE — ED Triage Notes (Signed)
Pt c/o dysuria, urgency and frequency w/urination.

## 2015-12-05 NOTE — ED Provider Notes (Signed)
[facility]    CSN: [id number] Arrival date & time: 12/05/15  1512     History   Chief Complaint Chief Complaint  Patient presents with  . Dysuria    HPI Carol Silva is a 45 y.o. female.   Patient complains of dysuria, urgency, hesitancy, and low back ache for four days   The history is provided by the patient.  Dysuria  Pain quality:  Burning Pain severity:  Moderate Onset quality:  Sudden Duration:  4 days Timing:  Constant Progression:  Worsening Chronicity:  New Recent urinary tract infections: no   Relieved by:  Phenazopyridine Worsened by:  Nothing Ineffective treatments:  None tried Urinary symptoms: frequent urination, hematuria and hesitancy   Urinary symptoms: no discolored urine, no foul-smelling urine and no bladder incontinence   Associated symptoms: no abdominal pain, no fever, no flank pain, no genital lesions, no nausea, no vaginal discharge and no vomiting   Risk factors: no recurrent urinary tract infections     Past Medical History:  Diagnosis Date  . ADD (attention deficit disorder) 09/01/2011  . GERD (gastroesophageal reflux disease) 09/01/2011    Patient Active Problem List   Diagnosis Date Noted  . SAB (spontaneous abortion) 05/24/2015  . Abdominal pain 04/26/2015  . Positive pregnancy test 04/26/2015  . Allergic conjunctivitis 04/06/2014  . Breast mass, right 04/06/2014  . Heavy period 01/19/2013  . Acne 01/19/2013  . Left breast mass 12/19/2012  . Right knee pain 07/05/2012  . GERD (gastroesophageal reflux disease) 09/01/2011  . ADD (attention deficit disorder) 09/01/2011    Past Surgical History:  Procedure Laterality Date  . BREAST LUMPECTOMY WITH RADIOACTIVE SEED LOCALIZATION Right 05/26/2014   Procedure: BREAST LUMPECTOMY WITH RADIOACTIVE SEED LOCALIZATION;  Surgeon: Abigail Miyamotoouglas Blackman, MD;  Location: [facility];  Service: General;  Laterality: Right;  . BREAST SURGERY     breast implants  bilaterally-   . CESAREAN SECTION    . DILATION AND EVACUATION N/A 05/24/2015   Procedure: DILATATION AND EVACUATION;  Surgeon: Levie HeritageJacob J Stinson, DO;  Location: [facility];  Service: Gynecology;  Laterality: N/A;    OB History    Gravida Para Term Preterm AB Living   2 1 0 1 0 2   SAB TAB Ectopic Multiple Live Births   0 0 0 1 1       Home Medications    Prior to Admission medications   Medication Sig Start Date End Date Taking? Authorizing Provider  nitrofurantoin, macrocrystal-monohydrate, (MACROBID) 100 MG capsule Take 1 capsule (100 mg total) by mouth 2 (two) times daily. Take with food. 12/05/15   Lattie HawStephen A Kenishia Plack, MD    Family History Family History  Problem Relation Age of Onset  . Diabetes Mother   . Hyperlipidemia Father   . Stroke Paternal Grandfather   . Diabetes Paternal Grandfather   . Cancer Neg Hx     Social History Social History  Substance Use Topics  . Smoking status: Never Smoker  . Smokeless tobacco: Never Used  . Alcohol use Yes     Comment: Drinks occasiionally on weekends      Allergies   Patient has no known allergies.   Review of Systems Review of Systems  Constitutional: Negative for fever.  Gastrointestinal: Negative for abdominal pain, nausea and vomiting.  Genitourinary: Positive for dysuria. Negative for flank pain and vaginal discharge.     Physical Exam Triage Vital Signs ED Triage Vitals  Enc Vitals Group  BP 12/05/15 1544 95/64     Pulse Rate 12/05/15 1544 71     Resp --      Temp 12/05/15 1544 98 F (36.7 C)     Temp Source 12/05/15 1544 Oral     SpO2 12/05/15 1544 97 %     Weight 12/05/15 1544 123 lb 12 oz (56.1 kg)     Height 12/05/15 1544 5\' 1"  (1.549 m)     Head Circumference --      Peak Flow --      Pain Score 12/05/15 1546 6     Pain Loc --      Pain Edu? --      Excl. in GC? --    No data found.   Updated Vital Signs BP 95/64 (BP Location: Left Arm)   Pulse 71   Temp 98 F (36.7 C) (Oral)   Ht 5'  1" (1.549 m)   Wt 123 lb 12 oz (56.1 kg)   LMP 03/24/2015 (Exact Date)   SpO2 97%   Breastfeeding? Unknown   BMI 23.38 kg/m   Visual Acuity Right Eye Distance:   Left Eye Distance:   Bilateral Distance:    Right Eye Near:   Left Eye Near:    Bilateral Near:     Physical Exam Nursing notes and Vital Signs reviewed. Appearance:  Patient appears stated age, and in no acute distress.    Eyes:  Pupils are equal, round, and reactive to light and accomodation.  Extraocular movement is intact.  Conjunctivae are not inflamed   Pharynx:  Normal; moist mucous membranes  Neck:  Supple.  No adenopathy Lungs:  Clear to auscultation.  Breath sounds are equal.  Moving air well. Heart:  Regular rate and rhythm without murmurs, rubs, or gallops.  Abdomen:  Nontender without masses or hepatosplenomegaly.  Bowel sounds are present.  No CVA or flank tenderness.  Extremities:  No edema.  Skin:  No rash present.     UC Treatments / Results  Labs (all labs ordered are listed, but only abnormal results are displayed) Labs Reviewed  POCT URINALYSIS DIP (MANUAL ENTRY) - Abnormal; Notable for the following:       Result Value   Color, UA light yellow (*)    Leukocytes, UA small (1+) (*)    All other components within normal limits  URINE CULTURE  POCT URINE PREGNANCY negative    EKG  EKG Interpretation None       Radiology No results found.  Procedures Procedures (including critical care time)  Medications Ordered in UC Medications - No data to display   Initial Impression / Assessment and Plan / UC Course  I have reviewed the triage vital signs and the nursing notes.  Pertinent labs & imaging results that were available during my care of the patient were reviewed by me and considered in my medical decision making (see chart for details).  Clinical Course   Begin Macrobid 100mg  BID.  Urine culture pending. Increase fluid intake.  May use non-prescription AZO for about two days,  if desired, to decrease urinary discomfort. If symptoms become significantly worse during the night or over the weekend, proceed to the local emergency room.      Final Clinical Impressions(s) / UC Diagnoses   Final diagnoses:  Cystitis    New Prescriptions New Prescriptions   NITROFURANTOIN, MACROCRYSTAL-MONOHYDRATE, (MACROBID) 100 MG CAPSULE    Take 1 capsule (100 mg total) by mouth 2 (two) times daily. Take with  food.     Lattie Haw, MD 12/05/15 [phone]

## 2015-12-07 ENCOUNTER — Telehealth: Payer: Self-pay | Admitting: *Deleted

## 2015-12-07 LAB — URINE CULTURE

## 2015-12-07 NOTE — Telephone Encounter (Signed)
Spoke to pt given Ucx results, complete ABT and f/u with PCP if she fails to improve.  

## 2016-05-04 ENCOUNTER — Encounter: Payer: Self-pay | Admitting: *Deleted

## 2016-05-04 ENCOUNTER — Emergency Department
Admission: EM | Admit: 2016-05-04 | Discharge: 2016-05-04 | Disposition: A | Discharge status: Home / Self Care | Payer: BLUE CROSS/BLUE SHIELD | Source: Home / Self Care | Attending: Family Medicine | Admitting: Family Medicine

## 2016-05-04 DIAGNOSIS — N898 Other specified noninflammatory disorders of vagina: Secondary | ICD-10-CM | POA: Diagnosis not present

## 2016-05-04 DIAGNOSIS — N760 Acute vaginitis: Secondary | ICD-10-CM

## 2016-05-04 LAB — POCT URINALYSIS DIP (MANUAL ENTRY)
Bilirubin, UA: NEGATIVE
Blood, UA: NEGATIVE
Glucose, UA: NEGATIVE mg/dL
Ketones, POC UA: NEGATIVE mg/dL
Leukocytes, UA: NEGATIVE
Nitrite, UA: NEGATIVE
Protein Ur, POC: NEGATIVE mg/dL
Spec Grav, UA: 1.015 (ref 1.010–1.025)
Urobilinogen, UA: 0.2 E.U./dL
pH, UA: 6.5 (ref 5.0–8.0)

## 2016-05-04 MED ORDER — FLUCONAZOLE 200 MG PO TABS
200.0000 mg | ORAL_TABLET | Freq: Once | ORAL | 0 refills | Status: AC
Start: 2016-05-04 — End: 2016-05-04

## 2016-05-04 NOTE — ED Provider Notes (Signed)
CSN: [id number]     Arrival date & time 05/04/16  1224 History   First MD Initiated Contact with Patient 05/04/16 1304     Chief Complaint  Patient presents with  . Vaginal Discharge  . Dysuria   (Consider location/radiation/quality/duration/timing/severity/associated sxs/prior Treatment) HPI  Carol Silva is a 46 y.o. female presenting to UC with c/o vaginal itching and burning, especially with urination for about 2-3 days, gradually worsening.  Today she noticed a greenish colored vaginal discharge.  She also reports having 2 cold sores on her lips, on on the upper lip and one in the corner of lower lip.  She has had fever blisters in the past that had similar sores. She uses an OTC vitamin E ointment, which provides moderate relief. Denies sores on inside of mouth or in genital area.  Denies fever, chills, n/v/d. Denies abdominal pain but has had mild lower back pain.  Pt denies significant concern for STDs as she is married, however, would still like to be tested for gonorrhea and chlamydia.  She has had yeast infections in the past. Denies recent antibiotic use.    Past Medical History:  Diagnosis Date  . ADD (attention deficit disorder) 09/01/2011  . GERD (gastroesophageal reflux disease) 09/01/2011   Past Surgical History:  Procedure Laterality Date  . BREAST LUMPECTOMY WITH RADIOACTIVE SEED LOCALIZATION Right 05/26/2014   Procedure: BREAST LUMPECTOMY WITH RADIOACTIVE SEED LOCALIZATION;  Surgeon: Abigail Miyamoto, MD;  Location: [facility];  Service: General;  Laterality: Right;  . BREAST SURGERY     breast implants bilaterally-   . CESAREAN SECTION    . DILATION AND EVACUATION N/A 05/24/2015   Procedure: DILATATION AND EVACUATION;  Surgeon: Levie Heritage, DO;  Location: [facility];  Service: Gynecology;  Laterality: N/A;   Family History  Problem Relation Age of Onset  . Diabetes Mother   . Hyperlipidemia Father   . Stroke Paternal Grandfather   . Diabetes  Paternal Grandfather   . Cancer Neg Hx    Social History  Substance Use Topics  . Smoking status: Never Smoker  . Smokeless tobacco: Never Used  . Alcohol use Yes     Comment: Drinks occasiionally on weekends    OB History    Gravida Para Term Preterm AB Living   2 1 0 1 0 2   SAB TAB Ectopic Multiple Live Births   0 0 0 1 1     Review of Systems  Constitutional: Negative for chills and fever.  Gastrointestinal: Negative for abdominal pain, diarrhea, nausea and vomiting.  Genitourinary: Positive for dysuria, vaginal discharge and vaginal pain (burning). Negative for flank pain, frequency, genital sores, pelvic pain, urgency and vaginal bleeding.  Musculoskeletal: Positive for back pain ( mild, lower). Negative for arthralgias, joint swelling and myalgias.  Skin: Positive for rash (fever blisters). Negative for color change.    Allergies  Patient has no known allergies.  Home Medications   Prior to Admission medications   Medication Sig Start Date End Date Taking? Authorizing Provider  fluconazole (DIFLUCAN) 200 MG tablet Take 1 tablet (200 mg total) by mouth once. May repeat in 3 days if still having symptoms 05/04/16 05/04/16  Junius Finner, PA-C   Meds Ordered and Administered this Visit  Medications - No data to display  BP 95/60 (BP Location: Left Arm)   Pulse 63   Temp 98.9 F (37.2 C) (Oral)   Wt 123 lb (55.8 kg)   LMP 04/13/2016   SpO2  100%   BMI 23.24 kg/m  No data found.   Physical Exam  Constitutional: She is oriented to person, place, and time. She appears well-developed and well-nourished. No distress.  HENT:  Head: Normocephalic and atraumatic.  Mouth/Throat:    Two tender vesicular lesions on lips. No active drainage. No sores inside mouth.   Eyes: EOM are normal.  Neck: Normal range of motion.  Cardiovascular: Normal rate and regular rhythm.   Pulmonary/Chest: Effort normal and breath sounds normal. No respiratory distress. She has no wheezes.  She has no rales.  Abdominal: Soft. She exhibits no distension and no mass. There is tenderness (mild, suprapubic). There is no rebound and no guarding.  Genitourinary:  Genitourinary Comments: Chaperoned exam. Normal external exam. Moderate amount of thick white vaginal discharge in vaginal canal. No bleeding. Mild cervical motion tenderness. No adnexal tenderness or masses.   Musculoskeletal: Normal range of motion.  Neurological: She is alert and oriented to person, place, and time.  Skin: Skin is warm and dry. She is not diaphoretic.  Psychiatric: She has a normal mood and affect. Her behavior is normal.  Nursing note and vitals reviewed.   Urgent Care Course     Procedures (including critical care time)  Labs Review Labs Reviewed  GC/CHLAMYDIA PROBE AMP  WET PREP BY MOLECULAR PROBE  POCT URINALYSIS DIP (MANUAL ENTRY)    Imaging Review No results found.    MDM   1. Vaginal discharge   2. Acute vaginitis    Pt c/o vaginal discharge with itching and burning. Exam c/w vaginal yeast infection. Will start pt on Diflucan while labs pending. Wet prep and GC/chlamydia swabs sent to lab.  Encouraged to use OTC Abreva for the fever blisters.     Junius Finner, PA-C 05/04/16 1357

## 2016-05-04 NOTE — ED Triage Notes (Signed)
Patient c/o 2-3 days of vaginal itching and burning especially when urinating. Today c/o green vaginal discharge and a sore on her upper lip. Would like STD testing.  Password for results: [id number]

## 2016-05-05 ENCOUNTER — Telehealth: Payer: Self-pay | Admitting: Emergency Medicine

## 2016-05-05 LAB — GC/CHLAMYDIA PROBE AMP
CT Probe RNA: NOT DETECTED
GC Probe RNA: NOT DETECTED

## 2016-05-05 LAB — WET PREP BY MOLECULAR PROBE
Candida species: DETECTED — AB
Gardnerella vaginalis: NOT DETECTED
Trichomonas vaginosis: NOT DETECTED

## 2016-05-05 NOTE — Telephone Encounter (Signed)
Patient informed of . Advised to call back with questions or concerns.

## 2016-05-17 ENCOUNTER — Encounter: Payer: Self-pay | Admitting: Physician Assistant

## 2016-05-17 ENCOUNTER — Ambulatory Visit (INDEPENDENT_AMBULATORY_CARE_PROVIDER_SITE_OTHER): Payer: BLUE CROSS/BLUE SHIELD | Admitting: Physician Assistant

## 2016-05-17 VITALS — BP 116/76 | HR 73 | Ht 61.0 in | Wt 126.0 lb

## 2016-05-17 DIAGNOSIS — R6882 Decreased libido: Secondary | ICD-10-CM | POA: Diagnosis not present

## 2016-05-17 DIAGNOSIS — R232 Flushing: Secondary | ICD-10-CM | POA: Diagnosis not present

## 2016-05-17 DIAGNOSIS — R5383 Other fatigue: Secondary | ICD-10-CM | POA: Diagnosis not present

## 2016-05-17 DIAGNOSIS — R4586 Emotional lability: Secondary | ICD-10-CM | POA: Insufficient documentation

## 2016-05-17 DIAGNOSIS — R635 Abnormal weight gain: Secondary | ICD-10-CM | POA: Diagnosis not present

## 2016-05-17 DIAGNOSIS — F39 Unspecified mood [affective] disorder: Secondary | ICD-10-CM

## 2016-05-17 NOTE — Progress Notes (Signed)
   Subjective:    Patient ID: Carol Silva, female    DOB: [birth year], 46 y.o.   MRN: [id number]  HPI  Pt is a 46 yo female who presents to the clinic with concerns about low libido, no energy, mood changes, weight gain, hot flashes and menstrual changes. She wonders if she could be in menopause as her mother started menopause early. She is still having monthly periods but they are light and short.    Review of Systems  All other systems reviewed and are negative.      Objective:   Physical Exam  Constitutional: She is oriented to person, place, and time. She appears well-developed and well-nourished.  HENT:  Head: Normocephalic and atraumatic.  Neck: Normal range of motion. Neck supple.  Cardiovascular: Normal rate, regular rhythm and normal heart sounds.   Pulmonary/Chest: Effort normal and breath sounds normal.  Lymphadenopathy:    She has no cervical adenopathy.  Neurological: She is alert and oriented to person, place, and time.  Psychiatric: She has a normal mood and affect.          Assessment & Plan:  Marland Kitchen.Marland Kitchen.Diagnoses and all orders for this visit:  No energy -     CBC -     Comprehensive metabolic panel -     C-reactive protein -     Ferritin -     Sedimentation rate -     TSH -     Vitamin B12 -     Vit D  25 hydroxy (rtn osteoporosis monitoring) -     LH -     Estrogens, total -     Testosterone -     Progesterone -     Follicle stimulating hormone  Mood changes (HCC) -     CBC -     Comprehensive metabolic panel -     C-reactive protein -     Ferritin -     Sedimentation rate -     TSH -     Vitamin B12 -     Vit D  25 hydroxy (rtn osteoporosis monitoring) -     LH -     Estrogens, total -     Testosterone -     Progesterone -     Follicle stimulating hormone  Hot flashes -     CBC -     Comprehensive metabolic panel -     C-reactive protein -     Ferritin -     Sedimentation rate -     TSH -     Vitamin B12 -     Vit D  25 hydroxy  (rtn osteoporosis monitoring) -     LH -     Estrogens, total -     Testosterone -     Progesterone -     Follicle stimulating hormone  Decreased libido -     Follicle stimulating hormone  Weight gain   Will get blood work. Pt denies any depression.

## 2016-05-18 LAB — COMPREHENSIVE METABOLIC PANEL
ALK PHOS: 82 U/L (ref 33–115)
ALT: 10 U/L (ref 6–29)
AST: 15 U/L (ref 10–30)
Albumin: 4.1 g/dL (ref 3.6–5.1)
BUN: 14 mg/dL (ref 7–25)
CO2: 24 mmol/L (ref 20–31)
Calcium: 9 mg/dL (ref 8.6–10.2)
Chloride: 106 mmol/L (ref 98–110)
Creat: 0.75 mg/dL (ref 0.50–1.10)
GLUCOSE: 96 mg/dL (ref 65–99)
POTASSIUM: 4 mmol/L (ref 3.5–5.3)
Sodium: 140 mmol/L (ref 135–146)
Total Bilirubin: 0.3 mg/dL (ref 0.2–1.2)
Total Protein: 6.7 g/dL (ref 6.1–8.1)

## 2016-05-18 LAB — PROGESTERONE: Progesterone: <0.5 ng/mL

## 2016-05-18 LAB — CBC
HEMATOCRIT: 41.6 % (ref 35.0–45.0)
Hemoglobin: 13.6 g/dL (ref 11.7–15.5)
MCH: 30.9 pg (ref 27.0–33.0)
MCHC: 32.7 g/dL (ref 32.0–36.0)
MCV: 94.5 fL (ref 80.0–100.0)
MPV: 9.9 fL (ref 7.5–12.5)
Platelets: 302 10*3/uL (ref 140–400)
RBC: 4.4 MIL/uL (ref 3.80–5.10)
RDW: 13.2 % (ref 11.0–15.0)
WBC: 6.8 10*3/uL (ref 3.8–10.8)

## 2016-05-18 LAB — LUTEINIZING HORMONE: LH: 6.6 m[IU]/mL

## 2016-05-18 LAB — C-REACTIVE PROTEIN: CRP: 2.5 mg/L (ref <8.0)

## 2016-05-18 LAB — FOLLICLE STIMULATING HORMONE: FSH: 3.6 m[IU]/mL

## 2016-05-18 LAB — TESTOSTERONE: Testosterone: 27 ng/dL

## 2016-05-18 LAB — FERRITIN: FERRITIN: 40 ng/mL (ref 10–232)

## 2016-05-18 LAB — SEDIMENTATION RATE: SED RATE: 5 mm/h (ref 0–20)

## 2016-05-18 LAB — VITAMIN B12: Vitamin B-12: 425 pg/mL (ref 200–1100)

## 2016-05-18 LAB — TSH: TSH: 2.09 m[IU]/L

## 2016-05-18 LAB — VITAMIN D 25 HYDROXY (VIT D DEFICIENCY, FRACTURES): Vit D, 25-Hydroxy: 30 ng/mL (ref 30–100)

## 2016-05-19 DIAGNOSIS — R635 Abnormal weight gain: Secondary | ICD-10-CM | POA: Insufficient documentation

## 2016-05-23 LAB — ESTROGENS, TOTAL: Estrogen: 579.5 pg/mL

## 2016-05-23 NOTE — Progress Notes (Signed)
Estrogen is at a great level.

## 2016-05-26 DIAGNOSIS — H5203 Hypermetropia, bilateral: Secondary | ICD-10-CM | POA: Diagnosis not present

## 2016-07-31 ENCOUNTER — Emergency Department (INDEPENDENT_AMBULATORY_CARE_PROVIDER_SITE_OTHER)
Admission: EM | Admit: 2016-07-31 | Discharge: 2016-07-31 | Disposition: A | Discharge status: Home / Self Care | Payer: BLUE CROSS/BLUE SHIELD | Source: Home / Self Care | Attending: Family Medicine | Admitting: Family Medicine

## 2016-07-31 DIAGNOSIS — T63461A Toxic effect of venom of wasps, accidental (unintentional), initial encounter: Secondary | ICD-10-CM | POA: Diagnosis not present

## 2016-07-31 MED ORDER — NEOMYCIN-POLYMYXIN-HC 3.5-10000-0.5 EX CREA: TOPICAL_CREAM | Freq: Three times a day (TID) | CUTANEOUS | 0 refills | Status: DC

## 2016-07-31 NOTE — ED Provider Notes (Signed)
CSN: [id number]     Arrival date & time 07/31/16  1802 History   First MD Initiated Contact with Patient 07/31/16 1829     Chief Complaint  Patient presents with  . Insect Bite   (Consider location/radiation/quality/duration/timing/severity/associated sxs/prior Treatment) HPI  Carol Silva is a 46 y.o. female presenting to UC with c/o being stung by a yellow jacket on July 9th.  No known allergies. She initially made a paste with peroxide and baking soda, applied to the wound but has not apply anything to area since initial incident.  She states she feels a small knot where she was stung. Minimal pain, mildly itchy. Area turns red when she touches it.  No fever, chills, n/v/d. No bleeding or drainage from the area.    Past Medical History:  Diagnosis Date  . ADD (attention deficit disorder) 09/01/2011  . GERD (gastroesophageal reflux disease) 09/01/2011   Past Surgical History:  Procedure Laterality Date  . BREAST LUMPECTOMY WITH RADIOACTIVE SEED LOCALIZATION Right 05/26/2014   Procedure: BREAST LUMPECTOMY WITH RADIOACTIVE SEED LOCALIZATION;  Surgeon: Abigail Miyamotoouglas Blackman, MD;  Location: [facility];  Service: General;  Laterality: Right;  . BREAST SURGERY     breast implants bilaterally-   . CESAREAN SECTION    . DILATION AND EVACUATION N/A 05/24/2015   Procedure: DILATATION AND EVACUATION;  Surgeon: Levie HeritageJacob J Stinson, DO;  Location: [facility];  Service: Gynecology;  Laterality: N/A;   Family History  Problem Relation Age of Onset  . Diabetes Mother   . Hyperlipidemia Father   . Stroke Paternal Grandfather   . Diabetes Paternal Grandfather   . Cancer Neg Hx    Social History  Substance Use Topics  . Smoking status: Never Smoker  . Smokeless tobacco: Never Used  . Alcohol use Yes     Comment: Drinks occasiionally on weekends    OB History    Gravida Para Term Preterm AB Living   2 1 0 1 0 2   SAB TAB Ectopic Multiple Live Births   0 0 0 1 1     Review of Systems    Constitutional: Negative for chills and fever.  Gastrointestinal: Negative for diarrhea and vomiting.  Musculoskeletal: Negative for arthralgias and myalgias.  Skin: Positive for color change and wound. Negative for rash.    Allergies  Patient has no known allergies.  Home Medications   Prior to Admission medications   Medication Sig Start Date End Date Taking? Authorizing Provider  neomycin-polymyxin-hydrocortisone (CORTISPORIN) 3.5-10000-0.5 cream Apply topically 3 (three) times daily. For 5-7 days after cleaning area with warm water and soap. 07/31/16   Carol ShadowPhelps, Aamani Moose O, PA-C   Meds Ordered and Administered this Visit  Medications - No data to display  BP 96/63 (BP Location: Left Arm)   Pulse 68   Temp 98.3 F (36.8 C) (Oral)   Ht 5\' 1"  (1.549 m)   Wt 125 lb (56.7 kg)   LMP 07/28/2016   SpO2 98%   BMI 23.62 kg/m  No data found.   Physical Exam  Constitutional: She is oriented to person, place, and time. She appears well-developed and well-nourished.  HENT:  Head: Normocephalic and atraumatic.  Eyes: EOM are normal.  Neck: Normal range of motion.  Cardiovascular: Normal rate.   Pulmonary/Chest: Effort normal.  Musculoskeletal: Normal range of motion. She exhibits no tenderness.       Arms: Neurological: She is alert and oriented to person, place, and time.  Skin: Skin is warm and  dry. No erythema.  Psychiatric: She has a normal mood and affect. Her behavior is normal.  Nursing note and vitals reviewed.   Urgent Care Course     Procedures (including critical care time)  Labs Review Labs Reviewed - No data to display  Imaging Review No results found.   MDM   1. Yellow jacket sting, accidental or unintentional, initial encounter    Pt c/o knot still present on Left forearm after a yellow jacket sting.    Skin lesion most c/w superficial abrasion.  Encouraged to gently exfoliate area with warm water and soap. Pat dry. Rx: cortisporin cream  F/u with  PCP in 1 week if not improving, sooner if worsening.     Carol Silva, [place] 08/01/16 [phone]

## 2016-07-31 NOTE — Discharge Instructions (Signed)
°  Gently exfoliate area with warm water and gentle soap. Pat dry. Then apply the antibiotic/hydrocortisone cream to area. You may apply a bandage to keep medication on the wound as long as possible.  Change bandage 3 times daily when you apply new medication.

## 2016-07-31 NOTE — ED Triage Notes (Signed)
Pt thinks she was stung by a yellow jacket on July 9.  Since then, she has has a sore, itchy spot on the left forearm.

## 2016-10-24 ENCOUNTER — Encounter: Payer: Self-pay | Admitting: Emergency Medicine

## 2016-10-24 ENCOUNTER — Emergency Department
Admission: EM | Admit: 2016-10-24 | Discharge: 2016-10-24 | Disposition: A | Discharge status: Home / Self Care | Payer: BLUE CROSS/BLUE SHIELD | Source: Home / Self Care | Attending: Family Medicine | Admitting: Family Medicine

## 2016-10-24 DIAGNOSIS — G43109 Migraine with aura, not intractable, without status migrainosus: Secondary | ICD-10-CM

## 2016-10-24 MED ORDER — DEXAMETHASONE SODIUM PHOSPHATE 10 MG/ML IJ SOLN
10.0000 mg | Freq: Once | INTRAMUSCULAR | Status: AC
  Administered 2016-10-24: 10 mg via INTRAMUSCULAR

## 2016-10-24 MED ORDER — KETOROLAC TROMETHAMINE 60 MG/2ML IM SOLN
60.0000 mg | Freq: Once | INTRAMUSCULAR | Status: AC
  Administered 2016-10-24: 60 mg via INTRAMUSCULAR

## 2016-10-24 MED ORDER — ASPIRIN-ACETAMINOPHEN-CAFFEINE 250-250-65 MG PO TABS: ORAL_TABLET | ORAL | 0 refills | Status: DC

## 2016-10-24 MED ORDER — METOCLOPRAMIDE HCL 5 MG/ML IJ SOLN
5.0000 mg | Freq: Once | INTRAMUSCULAR | Status: AC
  Administered 2016-10-24: 5 mg via INTRAMUSCULAR

## 2016-10-24 NOTE — ED Triage Notes (Signed)
Pt c/o HA x5 days. States it is intermittent but has worsened the last 2 days. She did get some relief with tylenol.

## 2016-10-24 NOTE — ED Provider Notes (Signed)
[facility]    CSN: [id number] Arrival date & time: 10/24/16  1718     History   Chief Complaint Chief Complaint  Patient presents with  . Headache    HPI Carol Silva is a 46 y.o. female.   HPI Carol Silva is a 46 y.o. female presenting to UC with c/o intermittent waxing and waning headache for 5 days but worse over the last 2 days. Associated mild nausea and photophobia. Hx of similar HAs in the past. Pain is aching and throbbing at times, worse on Right side.  She notes her mother gets migraines so she will ask her mother for Tylenol Migraine when she is with her but pt does not have that medication for herself.  She did take some Tylenol last night, which provided relief but she has not taken any medications today.  Denies dizziness, vomiting, fever, chills, or recent head injury.    Past Medical History:  Diagnosis Date  . ADD (attention deficit disorder) 09/01/2011  . GERD (gastroesophageal reflux disease) 09/01/2011    Patient Active Problem List   Diagnosis Date Noted  . Weight gain 05/19/2016  . Decreased libido 05/17/2016  . Hot flashes 05/17/2016  . Mood changes 05/17/2016  . No energy 05/17/2016  . SAB (spontaneous abortion) 05/24/2015  . Abdominal pain 04/26/2015  . Positive pregnancy test 04/26/2015  . Allergic conjunctivitis 04/06/2014  . Breast mass, right 04/06/2014  . Heavy period 01/19/2013  . Acne 01/19/2013  . Left breast mass 12/19/2012  . Right knee pain 07/05/2012  . GERD (gastroesophageal reflux disease) 09/01/2011  . ADD (attention deficit disorder) 09/01/2011    Past Surgical History:  Procedure Laterality Date  . BREAST LUMPECTOMY WITH RADIOACTIVE SEED LOCALIZATION Right 05/26/2014   Procedure: BREAST LUMPECTOMY WITH RADIOACTIVE SEED LOCALIZATION;  Surgeon: Abigail Miyamoto, MD;  Location: [facility];  Service: General;  Laterality: Right;  . BREAST SURGERY     breast implants bilaterally-   . CESAREAN  SECTION    . DILATION AND EVACUATION N/A 05/24/2015   Procedure: DILATATION AND EVACUATION;  Surgeon: Levie Heritage, DO;  Location: [facility];  Service: Gynecology;  Laterality: N/A;    OB History    Gravida Para Term Preterm AB Living   2 1 0 1 0 2   SAB TAB Ectopic Multiple Live Births   0 0 0 1 1       Home Medications    Prior to Admission medications   Medication Sig Start Date End Date Taking? Authorizing Provider  aspirin-acetaminophen-caffeine (EXCEDRIN MIGRAINE) [phone] MG tablet Take 1-2 tabes every 6-8 hours as needed for pain. 10/24/16   Lurene Shadow, PA-C  neomycin-polymyxin-hydrocortisone (CORTISPORIN) 3.5-10000-0.5 cream Apply topically 3 (three) times daily. For 5-7 days after cleaning area with warm water and soap. 07/31/16   Lurene Shadow, PA-C    Family History Family History  Problem Relation Age of Onset  . Diabetes Mother   . Hyperlipidemia Father   . Stroke Paternal Grandfather   . Diabetes Paternal Grandfather   . Cancer Neg Hx     Social History Social History  Substance Use Topics  . Smoking status: Never Smoker  . Smokeless tobacco: Never Used  . Alcohol use Yes     Comment: Drinks occasiionally on weekends      Allergies   Patient has no known allergies.   Review of Systems Review of Systems  Constitutional: Negative for chills and fever.  Eyes: Positive  for photophobia. Negative for pain and visual disturbance.  Cardiovascular: Negative for chest pain and palpitations.  Gastrointestinal: Positive for nausea. Negative for abdominal pain, diarrhea and vomiting.  Musculoskeletal: Negative for arthralgias, myalgias, neck pain and neck stiffness.  Skin: Negative for rash.  Neurological: Positive for headaches. Negative for dizziness and light-headedness.     Physical Exam Triage Vital Signs ED Triage Vitals  Enc Vitals Group     BP 10/24/16 1744 102/69     Pulse Rate 10/24/16 1744 68     Resp --      Temp 10/24/16 1744 97.8  F (36.6 C)     Temp Source 10/24/16 1744 Oral     SpO2 10/24/16 1744 98 %     Weight 10/24/16 1745 127 lb (57.6 kg)     Height --      Head Circumference --      Peak Flow --      Pain Score 10/24/16 1745 7     Pain Loc --      Pain Edu? --      Excl. in GC? --    No data found.   Updated Vital Signs BP 102/69 (BP Location: Right Arm)   Pulse 68   Temp 97.8 F (36.6 C) (Oral)   Wt 127 lb (57.6 kg)   LMP 10/18/2016   SpO2 98%   BMI 24.00 kg/m   Visual Acuity Right Eye Distance:   Left Eye Distance:   Bilateral Distance:    Right Eye Near:   Left Eye Near:    Bilateral Near:     Physical Exam  Constitutional: She is oriented to person, place, and time. She appears well-developed and well-nourished. No distress.  HENT:  Head: Normocephalic and atraumatic.  Right Ear: Tympanic membrane normal.  Left Ear: Tympanic membrane normal.  Nose: Nose normal.  Mouth/Throat: Uvula is midline, oropharynx is clear and moist and mucous membranes are normal.  Eyes: Pupils are equal, round, and reactive to light. Conjunctivae and EOM are normal.  Neck: Normal range of motion. Neck supple.  Cardiovascular: Normal rate and regular rhythm.   Pulmonary/Chest: Effort normal and breath sounds normal. No stridor. No respiratory distress. She has no wheezes. She has no rales.  Abdominal: Soft. She exhibits no distension. There is no tenderness.  Musculoskeletal: Normal range of motion.  Lymphadenopathy:    She has no cervical adenopathy.  Neurological: She is alert and oriented to person, place, and time. No cranial nerve deficit.  CN II-XII in tact. Speech is clear. Alert to person, place and time. Normal finger to nose coordination. Normal gait.  Skin: Skin is warm and dry. She is not diaphoretic.  Psychiatric: She has a normal mood and affect. Her behavior is normal.  Nursing note and vitals reviewed.    UC Treatments / Results  Labs (all labs ordered are listed, but only  abnormal results are displayed) Labs Reviewed - No data to display  EKG  EKG Interpretation None       Radiology No results found.  Procedures Procedures (including critical care time)  Medications Ordered in UC Medications  ketorolac (TORADOL) injection 60 mg (60 mg Intramuscular Given 10/24/16 1812)  metoCLOPramide (REGLAN) injection 5 mg (5 mg Intramuscular Given 10/24/16 1812)  dexamethasone (DECADRON) injection 10 mg (10 mg Intramuscular Given 10/24/16 1812)     Initial Impression / Assessment and Plan / UC Course  I have reviewed the triage vital signs and the nursing notes.  Pertinent labs &  imaging results that were available during my care of the patient were reviewed by me and considered in my medical decision making (see chart for details).     Hx and exam c/w migraine headache.  Pt given Toradol  IM, Reglan  IM, and Decadron  IM  HA improved from 7/10 to 0/10  Encouraged fluids and rest, f/u with PCP as needed. May take exedrin migraine as needed for HAs, however, encouraged to f/u with PCP if HAs becoming more frequent.   Final Clinical Impressions(s) / UC Diagnoses   Final diagnoses:  Migraine with aura and without status migrainosus, not intractable    New Prescriptions Discharge Medication List as of 10/24/2016  6:31 PM    START taking these medications   Details  aspirin-acetaminophen-caffeine (EXCEDRIN MIGRAINE) 250-250-65 MG tablet Take 1-2 tabes every 6-8 hours as needed for pain., Normal         Controlled Substance Prescriptions [place] Controlled Substance Registry consulted? Not Applicable   Rolla Plate 10/27/16 6578

## 2016-10-26 ENCOUNTER — Telehealth: Payer: Self-pay

## 2016-10-26 NOTE — Telephone Encounter (Signed)
Pt stated that headache is gone, but still feel pressure in her head.  Will follow up with PCP or UC as needed.

## 2016-12-27 ENCOUNTER — Emergency Department (INDEPENDENT_AMBULATORY_CARE_PROVIDER_SITE_OTHER): Payer: BLUE CROSS/BLUE SHIELD

## 2016-12-27 ENCOUNTER — Encounter: Payer: Self-pay | Admitting: Emergency Medicine

## 2016-12-27 ENCOUNTER — Emergency Department
Admission: EM | Admit: 2016-12-27 | Discharge: 2016-12-27 | Disposition: A | Discharge status: Home / Self Care | Payer: BLUE CROSS/BLUE SHIELD | Source: Home / Self Care | Attending: Family Medicine | Admitting: Family Medicine

## 2016-12-27 DIAGNOSIS — M940 Chondrocostal junction syndrome [Tietze]: Secondary | ICD-10-CM | POA: Diagnosis not present

## 2016-12-27 DIAGNOSIS — R1011 Right upper quadrant pain: Secondary | ICD-10-CM | POA: Diagnosis not present

## 2016-12-27 DIAGNOSIS — R079 Chest pain, unspecified: Secondary | ICD-10-CM

## 2016-12-27 DIAGNOSIS — R0789 Other chest pain: Secondary | ICD-10-CM | POA: Diagnosis not present

## 2016-12-27 LAB — COMPLETE METABOLIC PANEL WITH GFR
AG Ratio: 1.6 (calc) (ref 1.0–2.5)
ALT: 13 U/L (ref 6–29)
AST: 16 U/L (ref 10–30)
Albumin: 4.5 g/dL (ref 3.6–5.1)
Alkaline phosphatase (APISO): 88 U/L (ref 33–115)
BILIRUBIN TOTAL: 0.4 mg/dL (ref 0.2–1.2)
BUN: 10 mg/dL (ref 7–25)
CHLORIDE: 105 mmol/L (ref 98–110)
CO2: 24 mmol/L (ref 20–32)
Calcium: 9.6 mg/dL (ref 8.6–10.2)
Creat: 0.56 mg/dL (ref 0.50–1.10)
GFR, Est African American: 131 mL/min/{1.73_m2} (ref >=60)
GFR, Est Non African American: 113 mL/min/{1.73_m2} (ref >=60)
GLUCOSE: 96 mg/dL (ref 65–99)
Globulin: 2.8 g/dL (calc) (ref 1.9–3.7)
Potassium: 4 mmol/L (ref 3.5–5.3)
Sodium: 138 mmol/L (ref 135–146)
Total Protein: 7.3 g/dL (ref 6.1–8.1)

## 2016-12-27 LAB — POCT CBC W AUTO DIFF (K'VILLE URGENT CARE)

## 2016-12-27 LAB — POCT URINALYSIS DIP (MANUAL ENTRY)
Bilirubin, UA: NEGATIVE
Glucose, UA: NEGATIVE mg/dL
Ketones, POC UA: NEGATIVE mg/dL
LEUKOCYTES UA: NEGATIVE
NITRITE UA: NEGATIVE
PH UA: 7 (ref 5.0–8.0)
Protein Ur, POC: NEGATIVE mg/dL
Spec Grav, UA: 1.015 (ref 1.010–1.025)
UROBILINOGEN UA: 0.2 U/dL

## 2016-12-27 LAB — AMYLASE: AMYLASE: 55 U/L (ref 21–101)

## 2016-12-27 LAB — LIPASE: LIPASE: 40 U/L (ref 7–60)

## 2016-12-27 LAB — TSH: TSH: 1.88 m[IU]/L

## 2016-12-27 MED ORDER — HYDROCODONE-ACETAMINOPHEN 5-325 MG PO TABS: 1.0000 | ORAL_TABLET | Freq: Four times a day (QID) | ORAL | 0 refills | Status: DC | PRN

## 2016-12-27 NOTE — ED Triage Notes (Signed)
Pt c/o right sided rib pain that is sharp and tender to touch x2 days. Also c/o feeling bloated and frequent GI upset.

## 2016-12-27 NOTE — ED Provider Notes (Signed)
[facility]    CSN: [id number] Arrival date & time: 12/27/16  1218     History   Chief Complaint Chief Complaint  Patient presents with  . Abdominal Pain    HPI Carol Silva is a 46 y.o. female.   Patient presents with several complaints: 1) For about a year she has had recurring pain in her right anterior lower chest and upper abdomen, generally worse with movement.  She notes recurring nausea (without vomiting), abdominal bloating and decreased appetite.  Her symptoms are worse after eating spicy food.  During the past 3 months she has had frequent loose stools.  No hematochezia.  She states that she develops nausea after drinking an alcoholic beverage.  She has noted dark urine and believes that her eyes have been "darker." 2)  She feels fatigued, and is concerned that her weight has increased despite decreased appetite and food intake. Family history includes diabetes in her mother.  Her father has pancreatic cancer (preceded by a history of pancreatitis).   The history is provided by the patient.    Past Medical History:  Diagnosis Date  . ADD (attention deficit disorder) 09/01/2011  . GERD (gastroesophageal reflux disease) 09/01/2011    Patient Active Problem List   Diagnosis Date Noted  . Weight gain 05/19/2016  . Decreased libido 05/17/2016  . Hot flashes 05/17/2016  . Mood changes 05/17/2016  . No energy 05/17/2016  . SAB (spontaneous abortion) 05/24/2015  . Abdominal pain 04/26/2015  . Positive pregnancy test 04/26/2015  . Allergic conjunctivitis 04/06/2014  . Breast mass, right 04/06/2014  . Heavy period 01/19/2013  . Acne 01/19/2013  . Left breast mass 12/19/2012  . Right knee pain 07/05/2012  . GERD (gastroesophageal reflux disease) 09/01/2011  . ADD (attention deficit disorder) 09/01/2011    Past Surgical History:  Procedure Laterality Date  . BREAST LUMPECTOMY WITH RADIOACTIVE SEED LOCALIZATION Right 05/26/2014   Procedure: BREAST  LUMPECTOMY WITH RADIOACTIVE SEED LOCALIZATION;  Surgeon: Abigail Miyamotoouglas Blackman, MD;  Location: [facility];  Service: General;  Laterality: Right;  . BREAST SURGERY     breast implants bilaterally-   . CESAREAN SECTION    . DILATION AND EVACUATION N/A 05/24/2015   Procedure: DILATATION AND EVACUATION;  Surgeon: Levie HeritageJacob J Stinson, DO;  Location: [facility];  Service: Gynecology;  Laterality: N/A;    OB History    Gravida Para Term Preterm AB Living   2 1 0 1 0 2   SAB TAB Ectopic Multiple Live Births   0 0 0 1 1       Home Medications    Prior to Admission medications   Medication Sig Start Date End Date Taking? Authorizing Provider  aspirin-acetaminophen-caffeine (EXCEDRIN MIGRAINE) [id number] MG tablet Take 1-2 tabes every 6-8 hours as needed for pain. 10/24/16   Lurene ShadowPhelps, Erin O, PA-C  HYDROcodone-acetaminophen (NORCO/VICODIN) 5-325 MG tablet Take 1 tablet by mouth every 6 (six) hours as needed for moderate pain. 12/27/16   Lattie HawBeese, Jesusita Jocelyn A, MD  neomycin-polymyxin-hydrocortisone (CORTISPORIN) 3.5-10000-0.5 cream Apply topically 3 (three) times daily. For 5-7 days after cleaning area with warm water and soap. 07/31/16   Lurene ShadowPhelps, Erin O, PA-C    Family History Family History  Problem Relation Age of Onset  . Diabetes Mother   . Hyperlipidemia Father   . Stroke Paternal Grandfather   . Diabetes Paternal Grandfather   . Cancer Neg Hx     Social History Social History   Tobacco Use  . Smoking  status: Never Smoker  . Smokeless tobacco: Never Used  Substance Use Topics  . Alcohol use: Yes    Comment: Drinks occasiionally on weekends   . Drug use: No     Allergies   Patient has no known allergies.   Review of Systems Review of Systems  Constitutional: Positive for activity change, appetite change, fatigue and unexpected weight change. Negative for chills, diaphoresis and fever.  HENT: Negative.   Eyes:       Conjunctivae darker  Respiratory: Positive for chest  tightness. Negative for cough and shortness of breath.        Discomfort in right chest with deep inspiration  Cardiovascular: Negative for palpitations and leg swelling.  Gastrointestinal: Positive for abdominal distention, diarrhea and nausea. Negative for blood in stool, constipation and vomiting.  Genitourinary: Negative.   Musculoskeletal: Negative.   Skin: Negative.   Neurological: Negative.   All other systems reviewed and are negative.    Physical Exam Triage Vital Signs ED Triage Vitals  Enc Vitals Group     BP 12/27/16 1248 99/64     Pulse Rate 12/27/16 1248 66     Resp --      Temp 12/27/16 1248 98.1 F (36.7 C)     Temp Source 12/27/16 1248 Oral     SpO2 12/27/16 1248 100 %     Weight 12/27/16 1248 129 lb (58.5 kg)     Height --      Head Circumference --      Peak Flow --      Pain Score 12/27/16 1249 7     Pain Loc --      Pain Edu? --      Excl. in GC? --    No data found.  Updated Vital Signs BP 99/64 (BP Location: Right Arm)   Pulse 66   Temp 98.1 F (36.7 C) (Oral)   Wt 129 lb (58.5 kg)   SpO2 100%   BMI 24.37 kg/m   Visual Acuity Right Eye Distance:   Left Eye Distance:   Bilateral Distance:    Right Eye Near:   Left Eye Near:    Bilateral Near:     Physical Exam  Constitutional: She appears well-developed and well-nourished. She does not appear ill. No distress.  HENT:  Head: Normocephalic.  Mouth/Throat: Oropharynx is clear and moist.  Eyes: Conjunctivae and EOM are normal. Pupils are equal, round, and reactive to light. No scleral icterus.  Neck: Normal range of motion. Neck supple.  Cardiovascular: Normal heart sounds.  Pulmonary/Chest: Breath sounds normal.     She exhibits tenderness.  There is distinct tenderness to palpation over right anterior/inferior chest extending to the right lateral/inferior and posterior ribs.    Abdominal: Soft. She exhibits no distension and no mass. There is no hepatosplenomegaly. There is  tenderness in the right upper quadrant. There is no tenderness at McBurney's point and negative Murphy's sign.    Right inferior chest tenderness extends below costal margin, but no hepatomegaly palpated.  Musculoskeletal: She exhibits no edema.  Lymphadenopathy:    She has no cervical adenopathy.  Neurological: She is alert.  Skin: Skin is warm and dry. No rash noted.  Nursing note and vitals reviewed.    UC Treatments / Results  Labs (all labs ordered are listed, but only abnormal results are displayed) Labs Reviewed  POCT URINALYSIS DIP (MANUAL ENTRY) - Abnormal; Notable for the following components:      Result Value   Blood,  UA trace-intact (*)    All other components within normal limits  AMYLASE  LIPASE  COMPLETE METABOLIC PANEL WITH GFR  TSH  POCT CBC W AUTO DIFF ([facility]):  WBC 7.3; LY 43.1; MO 7.4; GR 49.5; Hgb 15.3; Platelets 294     EKG  EKG Interpretation None       Radiology Dg Ribs Unilateral W/chest Right  Result Date: 12/27/2016 CLINICAL DATA:  Pt c/o right sided chest pain x 3 weeks, pain has gotten worse over the past several days, pt can not recall any injury or trauma EXAM: RIGHT RIBS AND CHEST - 3+ VIEW COMPARISON:  None. FINDINGS: No fracture or other bone lesions are seen involving the ribs. There is no evidence of pneumothorax or pleural effusion. Both lungs are clear. Heart size and mediastinal contours are within normal limits. IMPRESSION: Negative. Electronically Signed   By: Charlett NoseKevin  Dover M.D.   On: 12/27/2016 15:28   [place] Abdomen Complete  Result Date: 12/27/2016 CLINICAL DATA:  Right upper quadrant pain for 3 months EXAM: ABDOMEN ULTRASOUND COMPLETE COMPARISON:  None. FINDINGS: Gallbladder: No gallstones or wall thickening visualized. No sonographic Murphy sign noted by sonographer. Common bile duct: Diameter: Normal caliber, 1 mm Liver: No focal lesion identified. Within normal limits in parenchymal echogenicity. Portal vein is  patent on color Doppler imaging with normal direction of blood flow towards the liver. IVC: No abnormality visualized. Pancreas: Visualized portion unremarkable. Spleen: Not well visualized, obscured by bowel gas. Right Kidney: Length: 9.6 cm. Echogenicity within normal limits. No mass or hydronephrosis visualized. Left Kidney: Length: 9.9 cm. Echogenicity within normal limits. No mass or hydronephrosis visualized. Abdominal aorta: No aneurysm visualized. Other findings: None. IMPRESSION: Unremarkable abdominal ultrasound. Electronically Signed   By: Charlett NoseKevin  Dover M.D.   On: 12/27/2016 14:22    Procedures Procedures (including critical care time)  Medications Ordered in UC Medications - No data to display   Initial Impression / Assessment and Plan / UC Course  I have reviewed the triage vital signs and the nursing notes.  Pertinent labs & imaging results that were available during my care of the patient were reviewed by me and considered in my medical decision making (see chart for details).    Initial concern for biliary colic dispelled by negative abdominal ultrasound with normal urinalysis and CBC. With complaints of persistent fatigue and weight gain, will check TSH. With family history of pancreatitis/pancreatic cancer (father) will check amylase, lipase, and CMP. At this time her right upper quadrant pain/right anterior chest pain appear to represent costochondritis; note normal x-ray chest & right unilateral ribs. Apply ice pack for 20 to 30 minutes, 3 to 4 times daily  Continue until pain and swelling decrease.  Rx for Lortab. Controlled Substance Prescriptions I have consulted the [place] Controlled Substances Registry for this patient, and feel the risk/benefit ratio today is favorable for proceeding with this prescription for a controlled substance.  Followup with Family Doctor for review of labs and further management.    Final Clinical Impressions(s) / UC Diagnoses   Final  diagnoses:  Costochondritis    ED Discharge Orders        Ordered    HYDROcodone-acetaminophen (NORCO/VICODIN) 5-325 MG tablet  Every 6 hours PRN     12/27/16 1549           Lattie HawBeese, Anahit Klumb A, MD 12/28/16 [phone]

## 2016-12-27 NOTE — Discharge Instructions (Signed)
Apply ice pack for 20 to 30 minutes, 3 to 4 times daily  Continue until pain and swelling decrease.  °

## 2016-12-28 ENCOUNTER — Telehealth: Payer: Self-pay

## 2016-12-28 NOTE — Telephone Encounter (Signed)
Called patient feeling some better.  Lab results given.  Will follow up with Carol Silva. Breeback for appointment.

## 2016-12-29 ENCOUNTER — Encounter: Payer: Self-pay | Admitting: Physician Assistant

## 2016-12-29 ENCOUNTER — Ambulatory Visit (INDEPENDENT_AMBULATORY_CARE_PROVIDER_SITE_OTHER): Payer: BLUE CROSS/BLUE SHIELD | Admitting: Physician Assistant

## 2016-12-29 VITALS — BP 103/58 | HR 67 | Ht 61.0 in | Wt 129.0 lb

## 2016-12-29 DIAGNOSIS — R140 Abdominal distension (gaseous): Secondary | ICD-10-CM | POA: Diagnosis not present

## 2016-12-29 DIAGNOSIS — R1011 Right upper quadrant pain: Secondary | ICD-10-CM | POA: Diagnosis not present

## 2016-12-29 DIAGNOSIS — R143 Flatulence: Secondary | ICD-10-CM

## 2016-12-29 MED ORDER — OMEPRAZOLE 40 MG PO CPDR: 40.0000 mg | DELAYED_RELEASE_CAPSULE | Freq: Every day | ORAL | 1 refills | Status: DC

## 2016-12-29 NOTE — Patient Instructions (Signed)
I want to treat this as acid reflux at this time. Start omeprazole once daily in am for 14 days. Let me know if any improvement.  Concern for h.pylori. Breath test ordered today.

## 2016-12-31 ENCOUNTER — Encounter: Payer: Self-pay | Admitting: Physician Assistant

## 2016-12-31 NOTE — Progress Notes (Signed)
Subjective:    Patient ID: Carol FragminYesenia Y Silva, female    DOB: [birth year], 46 y.o.   MRN: [id number]  HPI Pt is a 46 yo pleasant female who presents to the clinic to follow up on RUQ pain persistence after 12/19 UC appt.   Initial concern for biliary colic dispelled by negative abdominal ultrasound with normal urinalysis and CBC. With complaints of persistent fatigue and weight gain, will check TSH. With family history of pancreatitis/pancreatic cancer (father) will check amylase, lipase, and CMP. At this time her right upper quadrant pain/right anterior chest pain appear to represent costochondritis; note normal x-ray chest & right unilateral ribs. Apply ice pack for 20 to 30 minutes, 3 to 4 times daily  Continue until pain and swelling decrease.  Rx for Lortab.  Loratab does help with pain but not with bloating, gas, belching. No fever, chills, body aches. She has not been coughing and remembers to injury to ribs.  All labs were normal.  She mentions harder fewer stools than used too.   .. Active Ambulatory Problems    Diagnosis Date Noted  . GERD (gastroesophageal reflux disease) 09/01/2011  . ADD (attention deficit disorder) 09/01/2011  . Right knee pain 07/05/2012  . Left breast mass 12/19/2012  . Heavy period 01/19/2013  . Acne 01/19/2013  . Allergic conjunctivitis 04/06/2014  . Breast mass, right 04/06/2014  . Abdominal pain 04/26/2015  . Positive pregnancy test 04/26/2015  . SAB (spontaneous abortion) 05/24/2015  . Decreased libido 05/17/2016  . Hot flashes 05/17/2016  . Mood changes 05/17/2016  . No energy 05/17/2016  . Weight gain 05/19/2016   Resolved Ambulatory Problems    Diagnosis Date Noted  . Right Cervical radiculopathy 04/11/2012   Past Medical History:  Diagnosis Date  . ADD (attention deficit disorder) 09/01/2011  . GERD (gastroesophageal reflux disease) 09/01/2011      Review of Systems    see HPI.  Objective:   Physical Exam  Constitutional:  She is oriented to person, place, and time. She appears well-developed and well-nourished.  HENT:  Head: Normocephalic and atraumatic.  Eyes: Conjunctivae are normal.  Neck: Normal range of motion. Neck supple.  Cardiovascular: Normal rate, regular rhythm and normal heart sounds.  Pulmonary/Chest: Effort normal and breath sounds normal. She has no wheezes.  Abdominal: Soft. Bowel sounds are normal. She exhibits distension. She exhibits no mass. There is tenderness. There is no rebound and no guarding.  Diffuse upper epigastric and right sided tenderness. No guarding or rebound.  Abdomen appears slightly distended.   Neurological: She is alert and oriented to person, place, and time.  Psychiatric: She has a normal mood and affect. Her behavior is normal.          Assessment & Plan:  Marland Kitchen.Marland Kitchen.Diagnoses and all orders for this visit:  RUQ pain -     omeprazole (PRILOSEC) 40 MG capsule; Take 1 capsule (40 mg total) by mouth daily. -     H. pylori breath test  Bloating -     omeprazole (PRILOSEC) 40 MG capsule; Take 1 capsule (40 mg total) by mouth daily. -     H. pylori breath test  Flatulence -     omeprazole (PRILOSEC) 40 MG capsule; Take 1 capsule (40 mg total) by mouth daily. -     H. pylori breath test  I am concerned for h.pylori and/or some type of gastritis.  Breath test done today.  GI cocktail given in office.  Started omeprazole. Call on Wednesday and  let me know how symptoms are.  Reassuring she has already had normal abdominal u/s.  We could also look into food intolerances as well.

## 2017-01-03 DIAGNOSIS — R1011 Right upper quadrant pain: Secondary | ICD-10-CM | POA: Diagnosis not present

## 2017-01-03 DIAGNOSIS — R140 Abdominal distension (gaseous): Secondary | ICD-10-CM | POA: Diagnosis not present

## 2017-01-03 DIAGNOSIS — R143 Flatulence: Secondary | ICD-10-CM | POA: Diagnosis not present

## 2017-01-04 LAB — H. PYLORI BREATH TEST: H. PYLORI BREATH TEST: NOT DETECTED

## 2017-01-05 ENCOUNTER — Telehealth: Payer: Self-pay

## 2017-01-05 NOTE — Telephone Encounter (Signed)
-----   Message from Jomarie LongsJade L Breeback, [place]C sent at 01/04/2017 10:03 PM EST ----- Call pt: h.pylori is negative. How is bloating and upper abdominal pain? Any improvements?

## 2017-01-05 NOTE — Telephone Encounter (Signed)
U/S of abdomen normal, CXR normal, H.pylori normal. If not improving in next week with omeprazole then we can consider food intolerance testing or GI referral?

## 2017-01-05 NOTE — Telephone Encounter (Signed)
Called patient back, informed her of lab results, stated that the prescribed medication is helping some, but not much.

## 2017-02-21 ENCOUNTER — Encounter: Payer: Self-pay | Admitting: Physician Assistant

## 2017-02-21 ENCOUNTER — Ambulatory Visit: Payer: BLUE CROSS/BLUE SHIELD | Admitting: Physician Assistant

## 2017-02-21 VITALS — BP 97/59 | HR 75 | Ht 61.0 in | Wt 126.0 lb

## 2017-02-21 DIAGNOSIS — R1011 Right upper quadrant pain: Secondary | ICD-10-CM | POA: Diagnosis not present

## 2017-02-21 DIAGNOSIS — R140 Abdominal distension (gaseous): Secondary | ICD-10-CM | POA: Diagnosis not present

## 2017-02-21 DIAGNOSIS — R1013 Epigastric pain: Secondary | ICD-10-CM

## 2017-02-21 DIAGNOSIS — Z7189 Other specified counseling: Secondary | ICD-10-CM | POA: Diagnosis not present

## 2017-02-21 DIAGNOSIS — Z7184 Encounter for health counseling related to travel: Secondary | ICD-10-CM

## 2017-02-21 DIAGNOSIS — R143 Flatulence: Secondary | ICD-10-CM

## 2017-02-21 MED ORDER — OMEPRAZOLE 40 MG PO CPDR: 40.0000 mg | DELAYED_RELEASE_CAPSULE | Freq: Every day | ORAL | 1 refills | Status: DC

## 2017-02-21 NOTE — Progress Notes (Signed)
Subjective:    Patient ID: Carol Silva, female    DOB: [birth year], 48 y.o.   MRN: [id number]  HPI  Pt is a 47 yo female with hx of ongoing RUQ and epigastric pain who presents to the clinic for follow up and to discuss upcoming travel.  She was seen in the clinic on 12/29/16 for RUQ and epigastric pain.she has had negative u/s, normal UA, normal pancreatic enzymes, normal CXR. She was tested for h.pylori which was negative. She did use omeprazole for 2 weeks and it did help. She stopped and did not report back her symptoms. Denies any black tarry stools. She is nauseated a lot more with cream anc wine. No vomiting. Wine also bothers her. She also has more frequent stools and seems like she has to go to bathroom right after she eats anything.     She is leaving for [place] in April. She wonders about vaccines for travel.   .. Active Ambulatory Problems    Diagnosis Date Noted  . GERD (gastroesophageal reflux disease) 09/01/2011  . ADD (attention deficit disorder) 09/01/2011  . Right knee pain 07/05/2012  . Left breast mass 12/19/2012  . Heavy period 01/19/2013  . Acne 01/19/2013  . Allergic conjunctivitis 04/06/2014  . Breast mass, right 04/06/2014  . Abdominal pain 04/26/2015  . Positive pregnancy test 04/26/2015  . SAB (spontaneous abortion) 05/24/2015  . Decreased libido 05/17/2016  . Hot flashes 05/17/2016  . Mood changes 05/17/2016  . No energy 05/17/2016  . Weight gain 05/19/2016  . Epigastric abdominal pain 02/25/2017  . RUQ pain 02/25/2017  . Bloating 02/25/2017  . Flatulence 02/25/2017   Resolved Ambulatory Problems    Diagnosis Date Noted  . Right Cervical radiculopathy 04/11/2012   Past Medical History:  Diagnosis Date  . ADD (attention deficit disorder) 09/01/2011  . GERD (gastroesophageal reflux disease) 09/01/2011        Review of Systems  All other systems reviewed and are negative.      Objective:   Physical Exam  Constitutional:  She is oriented to person, place, and time. She appears well-developed and well-nourished.  HENT:  Head: Normocephalic and atraumatic.  Right Ear: External ear normal.  Left Ear: External ear normal.  Neck: Normal range of motion. Neck supple.  Cardiovascular: Normal rate, regular rhythm and normal heart sounds.  Pulmonary/Chest: Effort normal and breath sounds normal. She has no wheezes.  Abdominal: Soft. Bowel sounds are normal. She exhibits distension. She exhibits no mass. There is tenderness. There is no rebound and no guarding.  Mild tenderness over epigastric area. No LUQ tenderness. No RUQ tenderness today.   Neurological: She is alert and oriented to person, place, and time.  Psychiatric: She has a normal mood and affect. Her behavior is normal.          Assessment & Plan:   Marland KitchenMarland KitchenNilam was seen today for ruq pain, bloated and travel consult.  Diagnoses and all orders for this visit:  Counseling about travel  RUQ pain -     omeprazole (PRILOSEC) 40 MG capsule; Take 1 capsule (40 mg total) by mouth daily.  Bloating -     omeprazole (PRILOSEC) 40 MG capsule; Take 1 capsule (40 mg total) by mouth daily.  Flatulence -     omeprazole (PRILOSEC) 40 MG capsule; Take 1 capsule (40 mg total) by mouth daily.  Epigastric abdominal pain   Printed for her CDC recommendations for travel. She will consider vaccines. Tdap up to date.  Pt decline flu shots. Discussed I can give her Carol Silva preventative if needed closer to travel date. Typhoid/yellow fever consider health department. Hep A we can do.   DDX chronic cholecystitis vs gastritis vs IBS.could be even a combination. Restart omeprazole. Consider probiotics. No red flag warning signs. If resolution then hold on HIDA scan. If no resolution then consider HIDA scan and/or GI referral.

## 2017-02-21 NOTE — Patient Instructions (Signed)
Omeprazole 40 mg daily

## 2017-02-25 ENCOUNTER — Encounter: Payer: Self-pay | Admitting: Physician Assistant

## 2017-02-25 DIAGNOSIS — R1011 Right upper quadrant pain: Secondary | ICD-10-CM | POA: Insufficient documentation

## 2017-02-25 DIAGNOSIS — R143 Flatulence: Secondary | ICD-10-CM | POA: Insufficient documentation

## 2017-02-25 DIAGNOSIS — R140 Abdominal distension (gaseous): Secondary | ICD-10-CM | POA: Insufficient documentation

## 2017-02-25 DIAGNOSIS — R1013 Epigastric pain: Secondary | ICD-10-CM | POA: Insufficient documentation

## 2017-03-13 DIAGNOSIS — R1011 Right upper quadrant pain: Secondary | ICD-10-CM | POA: Diagnosis not present

## 2017-03-13 DIAGNOSIS — R1013 Epigastric pain: Secondary | ICD-10-CM | POA: Diagnosis not present

## 2017-03-22 DIAGNOSIS — K2950 Unspecified chronic gastritis without bleeding: Secondary | ICD-10-CM | POA: Diagnosis not present

## 2017-03-22 DIAGNOSIS — K3189 Other diseases of stomach and duodenum: Secondary | ICD-10-CM | POA: Diagnosis not present

## 2017-03-22 DIAGNOSIS — K2990 Gastroduodenitis, unspecified, without bleeding: Secondary | ICD-10-CM | POA: Diagnosis not present

## 2017-03-22 DIAGNOSIS — R1013 Epigastric pain: Secondary | ICD-10-CM | POA: Diagnosis not present

## 2017-03-27 ENCOUNTER — Other Ambulatory Visit: Payer: Self-pay | Admitting: *Deleted

## 2017-03-27 DIAGNOSIS — R140 Abdominal distension (gaseous): Secondary | ICD-10-CM

## 2017-03-27 DIAGNOSIS — R1011 Right upper quadrant pain: Secondary | ICD-10-CM

## 2017-03-27 DIAGNOSIS — R143 Flatulence: Secondary | ICD-10-CM

## 2017-03-27 MED ORDER — OMEPRAZOLE 40 MG PO CPDR: 40.0000 mg | DELAYED_RELEASE_CAPSULE | Freq: Every day | ORAL | 1 refills | Status: DC

## 2017-04-02 DIAGNOSIS — R1011 Right upper quadrant pain: Secondary | ICD-10-CM | POA: Diagnosis not present

## 2017-06-27 DIAGNOSIS — H524 Presbyopia: Secondary | ICD-10-CM | POA: Diagnosis not present

## 2017-10-10 ENCOUNTER — Other Ambulatory Visit: Payer: Self-pay | Admitting: Physician Assistant

## 2017-10-10 DIAGNOSIS — R143 Flatulence: Secondary | ICD-10-CM

## 2017-10-10 DIAGNOSIS — R1011 Right upper quadrant pain: Secondary | ICD-10-CM

## 2017-10-10 DIAGNOSIS — R140 Abdominal distension (gaseous): Secondary | ICD-10-CM

## 2017-10-12 ENCOUNTER — Ambulatory Visit (HOSPITAL_COMMUNITY)
Admission: EM | Admit: 2017-10-12 | Discharge: 2017-10-12 | Disposition: A | Discharge status: Home / Self Care | Payer: BLUE CROSS/BLUE SHIELD | Attending: Family Medicine | Admitting: Family Medicine

## 2017-10-12 ENCOUNTER — Other Ambulatory Visit: Payer: Self-pay

## 2017-10-12 ENCOUNTER — Encounter (HOSPITAL_COMMUNITY): Payer: Self-pay | Admitting: Family Medicine

## 2017-10-12 DIAGNOSIS — B9789 Other viral agents as the cause of diseases classified elsewhere: Secondary | ICD-10-CM

## 2017-10-12 DIAGNOSIS — J069 Acute upper respiratory infection, unspecified: Secondary | ICD-10-CM

## 2017-10-12 MED ORDER — HYDROCODONE-HOMATROPINE 5-1.5 MG/5ML PO SYRP: 5.0000 mL | ORAL_SOLUTION | Freq: Four times a day (QID) | ORAL | 0 refills | Status: DC | PRN

## 2017-10-12 MED ORDER — FLUTICASONE PROPIONATE 50 MCG/ACT NA SUSP: 2.0000 | Freq: Every day | NASAL | 12 refills | Status: DC

## 2017-10-12 NOTE — ED Provider Notes (Signed)
[facility]    CSN: [id number] Arrival date & time: 10/12/17  1458     History   Chief Complaint Chief Complaint  Patient presents with  . URI    HPI Carol Silva is a 47 y.o. female.   Pt reports nasal congestion, cough and body aches since Sunday.   Pt reports taking Day/Nyquil.  Patient recently returned from [place] where she picked up her father was dying of pancreatic cancer.  He is in hospice now.  On the way back from [place] patient developed chills and sweats.  She has a dry mouth from nasal congestion and mouth breathing.  She also has a productive cough with some green phlegm.      patient works in Engineer, maintenance.  She does not smoke and has no history of asthma.  There is no chest pain.     Past Medical History:  Diagnosis Date  . ADD (attention deficit disorder) 09/01/2011  . GERD (gastroesophageal reflux disease) 09/01/2011    Patient Active Problem List   Diagnosis Date Noted  . Epigastric abdominal pain 02/25/2017  . RUQ pain 02/25/2017  . Bloating 02/25/2017  . Flatulence 02/25/2017  . Weight gain 05/19/2016  . Decreased libido 05/17/2016  . Hot flashes 05/17/2016  . Mood changes 05/17/2016  . No energy 05/17/2016  . SAB (spontaneous abortion) 05/24/2015  . Abdominal pain 04/26/2015  . Positive pregnancy test 04/26/2015  . Allergic conjunctivitis 04/06/2014  . Breast mass, right 04/06/2014  . Heavy period 01/19/2013  . Acne 01/19/2013  . Left breast mass 12/19/2012  . Right knee pain 07/05/2012  . GERD (gastroesophageal reflux disease) 09/01/2011  . ADD (attention deficit disorder) 09/01/2011    Past Surgical History:  Procedure Laterality Date  . BREAST LUMPECTOMY WITH RADIOACTIVE SEED LOCALIZATION Right 05/26/2014   Procedure: BREAST LUMPECTOMY WITH RADIOACTIVE SEED LOCALIZATION;  Surgeon: Abigail Miyamoto, MD;  Location: [facility];  Service: General;  Laterality: Right;  . BREAST SURGERY     breast  implants bilaterally-   . CESAREAN SECTION    . DILATION AND EVACUATION N/A 05/24/2015   Procedure: DILATATION AND EVACUATION;  Surgeon: Levie Heritage, DO;  Location: [facility];  Service: Gynecology;  Laterality: N/A;    OB History    Gravida  2   Para  1   Term  0   Preterm  1   AB  0   Living  2     SAB  0   TAB  0   Ectopic  0   Multiple  1   Live Births  1            Home Medications    Prior to Admission medications   Medication Sig Start Date End Date Taking? Authorizing Provider  aspirin-acetaminophen-caffeine (EXCEDRIN MIGRAINE) [phone] MG tablet Take 1-2 tabes every 6-8 hours as needed for pain. 10/24/16   Lurene Shadow, PA-C  fluticasone (FLONASE) 50 MCG/ACT nasal spray Place 2 sprays into both nostrils daily. 10/12/17   Elvina Sidle, MD  HYDROcodone-homatropine (HYDROMET) 5-1.5 MG/5ML syrup Take 5 mLs by mouth every 6 (six) hours as needed for cough. 10/12/17   Elvina Sidle, MD  omeprazole (PRILOSEC) 40 MG capsule TAKE 1 CAPSULE BY MOUTH EVERY DAY 10/10/17   Jomarie Longs, PA-C    Family History Family History  Problem Relation Age of Onset  . Diabetes Mother   . Hyperlipidemia Father   . Stroke Paternal Grandfather   .  Diabetes Paternal Grandfather   . Cancer Neg Hx     Social History Social History   Tobacco Use  . Smoking status: Never Smoker  . Smokeless tobacco: Never Used  Substance Use Topics  . Alcohol use: Yes    Comment: Drinks occasiionally on weekends   . Drug use: No     Allergies   Patient has no known allergies.   Review of Systems Review of Systems  Constitutional: Positive for chills, diaphoresis, fatigue and fever.  HENT: Positive for congestion and sore throat.   Respiratory: Positive for cough.   Cardiovascular: Negative.   Gastrointestinal: Negative.   Musculoskeletal: Positive for myalgias.  Neurological: Positive for headaches.  Psychiatric/Behavioral: Negative.      Physical Exam Triage  Vital Signs ED Triage Vitals [10/12/17 1527]  Enc Vitals Group     BP 105/68     Pulse Rate 91     Resp      Temp 99.7 F (37.6 C)     Temp Source Oral     SpO2 99 %     Weight      Height      Head Circumference      Peak Flow      Pain Score 7     Pain Loc      Pain Edu?      Excl. in GC?    No data found.  Updated Vital Signs BP 105/68 (BP Location: Left Arm)   Pulse 91   Temp 99.7 F (37.6 C) (Oral)   LMP 09/27/2017   SpO2 99%   Physical Exam  Constitutional: She is oriented to person, place, and time. She appears well-developed and well-nourished.  HENT:  Right Ear: External ear normal.  Left Ear: External ear normal.  Mouth/Throat: Oropharynx is clear and moist.  TMs are normal  Eyes: Pupils are equal, round, and reactive to light. Conjunctivae and EOM are normal.  Neck: Normal range of motion. Neck supple.  Cardiovascular: Normal rate, regular rhythm and normal heart sounds.  Pulmonary/Chest: Effort normal and breath sounds normal.  Musculoskeletal: Normal range of motion.  Neurological: She is alert and oriented to person, place, and time.  Skin: Skin is warm and dry.  Nursing note and vitals reviewed.    UC Treatments / Results  Labs (all labs ordered are listed, but only abnormal results are displayed) Labs Reviewed - No data to display  EKG None  Radiology No results found.  Procedures Procedures (including critical care time)  Medications Ordered in UC Medications - No data to display  Initial Impression / Assessment and Plan / UC Course  I have reviewed the triage vital signs and the nursing notes.  Pertinent labs & imaging results that were available during my care of the patient were reviewed by me and considered in my medical decision making (see chart for details).    Final Clinical Impressions(s) / UC Diagnoses   Final diagnoses:  Viral URI with cough   Discharge Instructions   None    ED Prescriptions    Medication Sig  Dispense Auth. Provider   HYDROcodone-homatropine (HYDROMET) 5-1.5 MG/5ML syrup Take 5 mLs by mouth every 6 (six) hours as needed for cough. 100 mL Elvina Sidle, MD   fluticasone [facility]) 50 MCG/ACT nasal spray Place 2 sprays into both nostrils daily. 16 g Elvina Sidle, MD     Controlled Substance Prescriptions [place] Controlled Substance Registry consulted? Not Applicable   Elvina Sidle, MD 10/12/17 [phone]

## 2017-10-12 NOTE — ED Triage Notes (Signed)
Pt reports nasal congestion, cough and body aches since Sunday.   Pt reports taking Day/Nyquil.

## 2017-10-21 DIAGNOSIS — J180 Bronchopneumonia, unspecified organism: Secondary | ICD-10-CM | POA: Diagnosis not present

## 2017-11-07 DIAGNOSIS — H04123 Dry eye syndrome of bilateral lacrimal glands: Secondary | ICD-10-CM | POA: Diagnosis not present

## 2017-12-19 ENCOUNTER — Encounter: Payer: Self-pay | Admitting: Physician Assistant

## 2017-12-19 ENCOUNTER — Other Ambulatory Visit: Payer: Self-pay | Admitting: Physician Assistant

## 2017-12-19 DIAGNOSIS — Z1231 Encounter for screening mammogram for malignant neoplasm of breast: Secondary | ICD-10-CM

## 2017-12-20 ENCOUNTER — Telehealth: Payer: Self-pay

## 2017-12-20 DIAGNOSIS — N644 Mastodynia: Secondary | ICD-10-CM

## 2017-12-20 NOTE — Telephone Encounter (Signed)
[facility] [place] [facility] today on behalf of Jenne CampusYesenia. She is one of Jade's patient's. The tech said they are going to need an order for a diagnostic mammogram along with an order for an ultrasound. Patient was scheduled to have her mammogram done, but is now complaining of right breast tenderness and they have discovered a lump. I wanted to let you know since Lesly RubensteinJade is not here to make sure we can get the order in. Thanks so much!

## 2017-12-21 NOTE — Telephone Encounter (Signed)
Thank you so much

## 2017-12-21 NOTE — Telephone Encounter (Signed)
Orders are in

## 2018-01-08 ENCOUNTER — Ambulatory Visit: Admission: RE | Admit: 2018-01-08 | Payer: BLUE CROSS/BLUE SHIELD | Source: Ambulatory Visit

## 2018-01-08 ENCOUNTER — Ambulatory Visit: Payer: BLUE CROSS/BLUE SHIELD

## 2018-01-14 ENCOUNTER — Other Ambulatory Visit: Payer: BLUE CROSS/BLUE SHIELD

## 2018-01-28 ENCOUNTER — Ambulatory Visit
Admission: RE | Admit: 2018-01-28 | Discharge: 2018-01-28 | Disposition: A | Discharge status: Home / Self Care | Payer: BLUE CROSS/BLUE SHIELD | Source: Ambulatory Visit | Attending: Osteopathic Medicine | Admitting: Osteopathic Medicine

## 2018-01-28 DIAGNOSIS — R922 Inconclusive mammogram: Secondary | ICD-10-CM | POA: Diagnosis not present

## 2018-01-28 DIAGNOSIS — N644 Mastodynia: Secondary | ICD-10-CM | POA: Diagnosis not present

## 2018-01-30 ENCOUNTER — Encounter: Payer: Self-pay | Admitting: Physician Assistant

## 2018-01-30 DIAGNOSIS — N6019 Diffuse cystic mastopathy of unspecified breast: Secondary | ICD-10-CM | POA: Insufficient documentation

## 2018-02-27 DIAGNOSIS — M545 Low back pain: Secondary | ICD-10-CM | POA: Diagnosis not present

## 2018-03-12 DIAGNOSIS — F411 Generalized anxiety disorder: Secondary | ICD-10-CM | POA: Diagnosis not present

## 2018-03-12 DIAGNOSIS — R739 Hyperglycemia, unspecified: Secondary | ICD-10-CM | POA: Diagnosis not present

## 2018-03-12 DIAGNOSIS — Z0000 Encounter for general adult medical examination without abnormal findings: Secondary | ICD-10-CM | POA: Diagnosis not present

## 2018-03-17 ENCOUNTER — Emergency Department
Admission: EM | Admit: 2018-03-17 | Discharge: 2018-03-17 | Disposition: A | Discharge status: Home / Self Care | Payer: BLUE CROSS/BLUE SHIELD | Source: Home / Self Care

## 2018-03-17 ENCOUNTER — Telehealth: Payer: Self-pay | Admitting: Emergency Medicine

## 2018-03-17 ENCOUNTER — Encounter: Payer: Self-pay | Admitting: Emergency Medicine

## 2018-03-17 ENCOUNTER — Other Ambulatory Visit: Payer: Self-pay

## 2018-03-17 DIAGNOSIS — R112 Nausea with vomiting, unspecified: Secondary | ICD-10-CM

## 2018-03-17 DIAGNOSIS — R1013 Epigastric pain: Secondary | ICD-10-CM

## 2018-03-17 DIAGNOSIS — R197 Diarrhea, unspecified: Secondary | ICD-10-CM

## 2018-03-17 DIAGNOSIS — R1011 Right upper quadrant pain: Secondary | ICD-10-CM

## 2018-03-17 LAB — POCT CBC W AUTO DIFF (K'VILLE URGENT CARE)

## 2018-03-17 LAB — POCT URINALYSIS DIP (MANUAL ENTRY)
Bilirubin, UA: NEGATIVE
Blood, UA: NEGATIVE
Glucose, UA: NEGATIVE mg/dL
Leukocytes, UA: NEGATIVE
Nitrite, UA: NEGATIVE
Protein Ur, POC: 30 mg/dL — AB
Spec Grav, UA: 1.015 (ref 1.010–1.025)
Urobilinogen, UA: 0.2 E.U./dL
pH, UA: 7.5 (ref 5.0–8.0)

## 2018-03-17 LAB — COMPLETE METABOLIC PANEL WITH GFR
AG Ratio: 1.7 (calc) (ref 1.0–2.5)
ALT: 16 U/L (ref 6–29)
AST: 18 U/L (ref 10–35)
Albumin: 4.3 g/dL (ref 3.6–5.1)
Alkaline phosphatase (APISO): 81 U/L (ref 31–125)
BUN/Creatinine Ratio: 40 (calc) — ABNORMAL HIGH (ref 6–22)
BUN: 18 mg/dL (ref 7–25)
CO2: 22 mmol/L (ref 20–32)
Calcium: 9 mg/dL (ref 8.6–10.2)
Chloride: 105 mmol/L (ref 98–110)
Creat: 0.45 mg/dL — ABNORMAL LOW (ref 0.50–1.10)
GFR, Est African American: 140 mL/min/{1.73_m2} (ref >=60)
GFR, Est Non African American: 121 mL/min/{1.73_m2} (ref >=60)
Globulin: 2.5 g/dL (calc) (ref 1.9–3.7)
Glucose, Bld: 106 mg/dL — ABNORMAL HIGH (ref 65–99)
Potassium: 3.8 mmol/L (ref 3.5–5.3)
Sodium: 138 mmol/L (ref 135–146)
Total Bilirubin: 0.9 mg/dL (ref 0.2–1.2)
Total Protein: 6.8 g/dL (ref 6.1–8.1)

## 2018-03-17 LAB — LIPASE: Lipase: 37 U/L (ref 7–60)

## 2018-03-17 MED ORDER — ONDANSETRON 4 MG PO TBDP
4.0000 mg | ORAL_TABLET | Freq: Once | ORAL | Status: AC
  Administered 2018-03-17: 4 mg via ORAL

## 2018-03-17 MED ORDER — ONDANSETRON HCL 4 MG PO TABS: 4.0000 mg | ORAL_TABLET | Freq: Four times a day (QID) | ORAL | 0 refills | Status: DC

## 2018-03-17 NOTE — ED Provider Notes (Signed)
[facility]    CSN: [id number] Arrival date & time: 03/17/18  1129     History   Chief Complaint Chief Complaint  Patient presents with  . GI Problem    HPI Carol Silva is a 48 y.o. female.   HPI  Carol Silva is a 48 y.o. female presenting to UC with c/o n/v/d and abdominal pain that started early this morning. She has forced herself to vomit 3 times due to severe nausea and has had 3-4 episodes of diarrhea this morning. Abdominal pain is cramping in nature, waxes and wanes. Hx of GERD. She was prescribed omeprazole last year but symptoms resolved so she stopped taking it until about 1 week ago. Denies fever or chills. No cough or congestion. She was seen by a stomach specialist last year for similar symptoms but they could not find the source of her pain.  Denies urinary symptoms. No sick contacts or recent travel. She had chicken last night along with other family members but pt is the only one with GI symptoms.    Past Medical History:  Diagnosis Date  . ADD (attention deficit disorder) 09/01/2011  . GERD (gastroesophageal reflux disease) 09/01/2011    Patient Active Problem List   Diagnosis Date Noted  . Fibrocystic breast changes 01/30/2018  . Epigastric abdominal pain 02/25/2017  . RUQ pain 02/25/2017  . Bloating 02/25/2017  . Flatulence 02/25/2017  . Weight gain 05/19/2016  . Decreased libido 05/17/2016  . Hot flashes 05/17/2016  . Mood changes 05/17/2016  . No energy 05/17/2016  . SAB (spontaneous abortion) 05/24/2015  . Abdominal pain 04/26/2015  . Positive pregnancy test 04/26/2015  . Allergic conjunctivitis 04/06/2014  . Breast mass, right 04/06/2014  . Heavy period 01/19/2013  . Acne 01/19/2013  . Left breast mass 12/19/2012  . Right knee pain 07/05/2012  . GERD (gastroesophageal reflux disease) 09/01/2011  . ADD (attention deficit disorder) 09/01/2011    Past Surgical History:  Procedure Laterality Date  . BREAST LUMPECTOMY WITH  RADIOACTIVE SEED LOCALIZATION Right 05/26/2014   Procedure: BREAST LUMPECTOMY WITH RADIOACTIVE SEED LOCALIZATION;  Surgeon: Abigail Miyamoto, MD;  Location: [facility];  Service: General;  Laterality: Right;  . BREAST SURGERY     breast implants bilaterally-   . CESAREAN SECTION    . DILATION AND EVACUATION N/A 05/24/2015   Procedure: DILATATION AND EVACUATION;  Surgeon: Levie Heritage, DO;  Location: [facility];  Service: Gynecology;  Laterality: N/A;    OB History    Gravida  2   Para  1   Term  0   Preterm  1   AB  0   Living  2     SAB  0   TAB  0   Ectopic  0   Multiple  1   Live Births  1            Home Medications    Prior to Admission medications   Medication Sig Start Date End Date Taking? Authorizing Provider  aspirin-acetaminophen-caffeine (EXCEDRIN MIGRAINE) [phone] MG tablet Take 1-2 tabes every 6-8 hours as needed for pain. 10/24/16   Lurene Shadow, PA-C  fluticasone (FLONASE) 50 MCG/ACT nasal spray Place 2 sprays into both nostrils daily. 10/12/17   Elvina Sidle, MD  HYDROcodone-homatropine (HYDROMET) 5-1.5 MG/5ML syrup Take 5 mLs by mouth every 6 (six) hours as needed for cough. 10/12/17   Elvina Sidle, MD  omeprazole (PRILOSEC) 40 MG capsule TAKE 1 CAPSULE BY MOUTH  EVERY DAY 10/10/17   Breeback, Jade L, PA-C  ondansetron (ZOFRAN) 4 MG tablet Take 1 tablet (4 mg total) by mouth every 6 (six) hours. 03/17/18   Lurene Shadow, PA-C    Family History Family History  Problem Relation Age of Onset  . Diabetes Mother   . Hyperlipidemia Father   . Stroke Paternal Grandfather   . Diabetes Paternal Grandfather   . Cancer Neg Hx     Social History Social History   Tobacco Use  . Smoking status: Never Smoker  . Smokeless tobacco: Never Used  Substance Use Topics  . Alcohol use: Yes    Comment: Drinks occasiionally on weekends   . Drug use: No     Allergies   Patient has no known allergies.   Review of Systems Review  of Systems  Constitutional: Negative for chills and fever.  HENT: Negative for congestion, ear pain, sore throat, trouble swallowing and voice change.   Respiratory: Negative for cough and shortness of breath.   Cardiovascular: Negative for chest pain and palpitations.  Gastrointestinal: Positive for abdominal pain, diarrhea, nausea and vomiting.  Musculoskeletal: Negative for arthralgias, back pain and myalgias.  Skin: Negative for rash.     Physical Exam Triage Vital Signs ED Triage Vitals [03/17/18 1200]  Enc Vitals Group     BP 103/68     Pulse Rate 93     Resp      Temp 98.5 F (36.9 C)     Temp Source Oral     SpO2 98 %     Weight 127 lb 12.8 oz (58 kg)     Height 5\' 1"  (1.549 m)     Head Circumference      Peak Flow      Pain Score 5     Pain Loc      Pain Edu?      Excl. in GC?    No data found.  Updated Vital Signs BP 103/68 (BP Location: Right Arm)   Pulse 93   Temp 98.5 F (36.9 C) (Oral)   Ht 5\' 1"  (1.549 m)   Wt 127 lb 12.8 oz (58 kg)   LMP 03/01/2018 (Approximate)   SpO2 98%   BMI 24.15 kg/m   Visual Acuity Right Eye Distance:   Left Eye Distance:   Bilateral Distance:    Right Eye Near:   Left Eye Near:    Bilateral Near:     Physical Exam Vitals signs and nursing note reviewed.  Constitutional:      Appearance: Normal appearance. She is well-developed.  HENT:     Head: Normocephalic and atraumatic.     Nose: Nose normal.     Mouth/Throat:     Mouth: Mucous membranes are moist.  Eyes:     Extraocular Movements: Extraocular movements intact.     Conjunctiva/sclera: Conjunctivae normal.  Neck:     Musculoskeletal: Normal range of motion.  Cardiovascular:     Rate and Rhythm: Normal rate and regular rhythm.  Pulmonary:     Effort: Pulmonary effort is normal. No respiratory distress.     Breath sounds: Normal breath sounds. No stridor. No wheezing or rhonchi.  Abdominal:     General: Bowel sounds are normal. There is no distension.      Palpations: Abdomen is soft. There is no mass.     Tenderness: There is abdominal tenderness. There is no right CVA tenderness, left CVA tenderness, guarding or rebound.     Hernia:  No hernia is present.    Musculoskeletal: Normal range of motion.  Skin:    General: Skin is warm and dry.  Neurological:     Mental Status: She is alert and oriented to person, place, and time.  Psychiatric:        Behavior: Behavior normal.      UC Treatments / Results  Labs (all labs ordered are listed, but only abnormal results are displayed) Labs Reviewed  POCT URINALYSIS DIP (MANUAL ENTRY) - Abnormal; Notable for the following components:      Result Value   Ketones, POC UA small (15) (*)    Protein Ur, POC =30 (*)    All other components within normal limits  URINE CULTURE  COMPLETE METABOLIC PANEL WITH GFR  LIPASE  POCT CBC W AUTO DIFF ([facility])    EKG None  Radiology No results found.  Procedures Procedures (including critical care time)  Medications Ordered in UC Medications  ondansetron (ZOFRAN-ODT) disintegrating tablet 4 mg (4 mg Oral Given 03/17/18 1229)    Initial Impression / Assessment and Plan / UC Course  I have reviewed the triage vital signs and the nursing notes.  Pertinent labs & imaging results that were available during my care of the patient were reviewed by me and considered in my medical decision making (see chart for details).     UA c/w mild dehydration but no definite UTI Culture sent CBC: unremarkable  CMP and Lipase pending. Pt does have upper and RUQ abdominal pain, however, no rebound or guarding.  Doubt surgical abdomen at this time. Symptoms likely viral in nature Encouraged good hydration F/u with PCP and GI Discussed symptoms that warrant emergent care in the ED.  Final Clinical Impressions(s) / UC Diagnoses   Final diagnoses:  Abdominal pain, right upper quadrant  Abdominal pain, epigastric  Nausea vomiting and  diarrhea     Discharge Instructions      Your symptoms are likely due to a stomach virus and should resolve within 2-3 days.   If pain continues to worsen or you are unable to keep down fluids, develop a high fever, or other new concerning symptoms develop, please go to the hospital for further evaluation of your symptoms.  It is recommended you follow up with your family medicine provider this week as well as schedule an appointment with a stomach specialist for evaluation of recurrent stomach pain.      ED Prescriptions    Medication Sig Dispense Auth. Provider   ondansetron (ZOFRAN) 4 MG tablet Take 1 tablet (4 mg total) by mouth every 6 (six) hours. 12 tablet Lurene Shadow, PA-C     Controlled Substance Prescriptions [place] Controlled Substance Registry consulted? Not Applicable   Lurene Shadow, PA-C 03/17/18 1340

## 2018-03-17 NOTE — Discharge Instructions (Addendum)
°  Your symptoms are likely due to a stomach virus and should resolve within 2-3 days.   If pain continues to worsen or you are unable to keep down fluids, develop a high fever, or other new concerning symptoms develop, please go to the hospital for further evaluation of your symptoms.  It is recommended you follow up with your family medicine provider this week as well as schedule an appointment with a stomach specialist for evaluation of recurrent stomach pain.

## 2018-03-17 NOTE — Telephone Encounter (Signed)
Spoke with patient. Communicated stat lab results. Advised to drink plenty of fluids for low Creatine and f/u with PCP on Monday to have bloodwork repeated. Verbalized understanding

## 2018-03-17 NOTE — ED Triage Notes (Signed)
Here with piossible GI infection- nausea, diarrhea, body aches and right abdominal cramping. Started yesterday. x1 episode vomiting this am.

## 2018-03-18 DIAGNOSIS — R1011 Right upper quadrant pain: Secondary | ICD-10-CM | POA: Diagnosis not present

## 2018-03-18 DIAGNOSIS — K529 Noninfective gastroenteritis and colitis, unspecified: Secondary | ICD-10-CM | POA: Diagnosis not present

## 2018-03-18 DIAGNOSIS — R1013 Epigastric pain: Secondary | ICD-10-CM | POA: Diagnosis not present

## 2018-03-18 LAB — URINE CULTURE
MICRO NUMBER:: 292693
Result:: NO GROWTH
SPECIMEN QUALITY:: ADEQUATE

## 2018-03-19 ENCOUNTER — Telehealth: Payer: Self-pay

## 2018-03-19 NOTE — Telephone Encounter (Signed)
Spoke with patient, is feeling better.  Given lab results.  Will follow up as needed.

## 2018-03-22 DIAGNOSIS — R109 Unspecified abdominal pain: Secondary | ICD-10-CM | POA: Diagnosis not present

## 2018-03-22 DIAGNOSIS — R1011 Right upper quadrant pain: Secondary | ICD-10-CM | POA: Diagnosis not present

## 2018-03-22 DIAGNOSIS — R1013 Epigastric pain: Secondary | ICD-10-CM | POA: Diagnosis not present

## 2018-03-26 DIAGNOSIS — R1013 Epigastric pain: Secondary | ICD-10-CM | POA: Diagnosis not present

## 2018-03-26 DIAGNOSIS — E739 Lactose intolerance, unspecified: Secondary | ICD-10-CM | POA: Diagnosis not present

## 2018-03-26 DIAGNOSIS — R1011 Right upper quadrant pain: Secondary | ICD-10-CM | POA: Diagnosis not present

## 2019-01-01 ENCOUNTER — Other Ambulatory Visit: Payer: BLUE CROSS/BLUE SHIELD

## 2019-02-13 ENCOUNTER — Telehealth: Payer: Self-pay | Admitting: Family Medicine

## 2019-02-13 NOTE — Telephone Encounter (Signed)
Patient called in wanting to make an appointment with [facility]. I was showing that her PCP is Dr.Moreira. Patient was last seen with [place] in 2019. Please advise.

## 2019-02-14 ENCOUNTER — Emergency Department
Admission: EM | Admit: 2019-02-14 | Discharge: 2019-02-14 | Disposition: A | Discharge status: Home / Self Care | Payer: Managed Care, Other (non HMO) | Source: Home / Self Care

## 2019-02-14 ENCOUNTER — Other Ambulatory Visit: Payer: Self-pay

## 2019-02-14 DIAGNOSIS — R3129 Other microscopic hematuria: Secondary | ICD-10-CM | POA: Diagnosis not present

## 2019-02-14 DIAGNOSIS — M5442 Lumbago with sciatica, left side: Secondary | ICD-10-CM | POA: Diagnosis not present

## 2019-02-14 DIAGNOSIS — N941 Unspecified dyspareunia: Secondary | ICD-10-CM | POA: Diagnosis not present

## 2019-02-14 LAB — POCT URINALYSIS DIP (MANUAL ENTRY)
Bilirubin, UA: NEGATIVE
Glucose, UA: NEGATIVE mg/dL
Ketones, POC UA: NEGATIVE mg/dL
Leukocytes, UA: NEGATIVE
Nitrite, UA: NEGATIVE
Protein Ur, POC: NEGATIVE mg/dL
Spec Grav, UA: 1.01 (ref 1.010–1.025)
Urobilinogen, UA: 0.2 E.U./dL
pH, UA: 5.5 (ref 5.0–8.0)

## 2019-02-14 MED ORDER — PREDNISONE 10 MG (21) PO TBPK: ORAL_TABLET | Freq: Every day | ORAL | 0 refills | Status: DC

## 2019-02-14 NOTE — ED Provider Notes (Signed)
[facility]    CSN: [id number] Arrival date & time: 02/14/19  1733      History   Chief Complaint Chief Complaint  Patient presents with  . Back Pain    lower    HPI Carol Silva is a 49 y.o. female.   49 year old female, with history of ADD, GERD, presenting today complaining of left-sided low back pain.  States that she has had pain for the past few days.  States that it is primarily located in her left SI joint and radiates down her left buttock and her left thigh.  Patient states that she has been told before that she had blood in her urine and they were concerned about a possible kidney stone.  Patient denies any flank pain at this time.  States that she does have some low back pain that radiates around to her left groin.  States that she had an episode of dyspareunia within the past few days which is new for her.  She denies any dysuria.  No vaginal bleeding or discharge.  No injury to her back.  No loss of bowel or bladder function, saddle anesthesia, numbness or weakness in the lower extremities.  The history is provided by the patient.  Back Pain Location:  Sacro-iliac joint Quality:  Aching Radiates to:  L posterior upper leg Pain severity:  Moderate Pain is:  Same all the time Onset quality:  Gradual Timing:  Constant Progression:  Unchanged Chronicity:  New Context: not emotional stress, not falling, not jumping from heights and not lifting heavy objects   Relieved by:  Nothing Worsened by:  Ambulation, twisting, sitting and bending Ineffective treatments:  None tried Associated symptoms: leg pain and pelvic pain   Associated symptoms: no abdominal pain, no abdominal swelling, no bladder incontinence, no bowel incontinence, no chest pain, no dysuria, no fever, no headaches, no numbness, no paresthesias, no perianal numbness, no tingling, no weakness and no weight loss   Risk factors: no hx of cancer, no hx of osteoporosis, no lack of exercise, no  menopause, not obese and not pregnant     Past Medical History:  Diagnosis Date  . ADD (attention deficit disorder) 09/01/2011  . GERD (gastroesophageal reflux disease) 09/01/2011    Patient Active Problem List   Diagnosis Date Noted  . Fibrocystic breast changes 01/30/2018  . Epigastric abdominal pain 02/25/2017  . RUQ pain 02/25/2017  . Bloating 02/25/2017  . Flatulence 02/25/2017  . Weight gain 05/19/2016  . Decreased libido 05/17/2016  . Hot flashes 05/17/2016  . Mood changes 05/17/2016  . No energy 05/17/2016  . SAB (spontaneous abortion) 05/24/2015  . Abdominal pain 04/26/2015  . Positive pregnancy test 04/26/2015  . Allergic conjunctivitis 04/06/2014  . Breast mass, right 04/06/2014  . Heavy period 01/19/2013  . Acne 01/19/2013  . Left breast mass 12/19/2012  . Right knee pain 07/05/2012  . GERD (gastroesophageal reflux disease) 09/01/2011  . ADD (attention deficit disorder) 09/01/2011    Past Surgical History:  Procedure Laterality Date  . BREAST LUMPECTOMY WITH RADIOACTIVE SEED LOCALIZATION Right 05/26/2014   Procedure: BREAST LUMPECTOMY WITH RADIOACTIVE SEED LOCALIZATION;  Surgeon: Abigail Miyamoto, MD;  Location: [facility];  Service: General;  Laterality: Right;  . BREAST SURGERY     breast implants bilaterally-   . CESAREAN SECTION    . DILATION AND EVACUATION N/A 05/24/2015   Procedure: DILATATION AND EVACUATION;  Surgeon: Levie Heritage, DO;  Location: [facility];  Service: Gynecology;  Laterality: N/A;    OB History    Gravida  2   Para  1   Term  0   Preterm  1   AB  0   Living  2     SAB  0   TAB  0   Ectopic  0   Multiple  1   Live Births  1            Home Medications    Prior to Admission medications   Medication Sig Start Date End Date Taking? Authorizing Provider  aspirin-acetaminophen-caffeine (EXCEDRIN MIGRAINE) [phone] MG tablet Take 1-2 tabes every 6-8 hours as needed for pain. 10/24/16   Noe Gens, PA-C  fluticasone (FLONASE) 50 MCG/ACT nasal spray Place 2 sprays into both nostrils daily. 10/12/17   Robyn Haber, MD  HYDROcodone-homatropine (HYDROMET) 5-1.5 MG/5ML syrup Take 5 mLs by mouth every 6 (six) hours as needed for cough. 10/12/17   Robyn Haber, MD  omeprazole (PRILOSEC) 40 MG capsule TAKE 1 CAPSULE BY MOUTH EVERY DAY 10/10/17   Breeback, Jade L, PA-C  ondansetron (ZOFRAN) 4 MG tablet Take 1 tablet (4 mg total) by mouth every 6 (six) hours. 03/17/18   Noe Gens, PA-C  predniSONE (STERAPRED UNI-PAK 21 TAB) 10 MG (21) TBPK tablet Take by mouth daily. Take 6 tabs by mouth daily  for 2 days, then 5 tabs for 2 days, then 4 tabs for 2 days, then 3 tabs for 2 days, 2 tabs for 2 days, then 1 tab by mouth daily for 2 days 02/14/19   Phebe Colla, PA-C    Family History Family History  Problem Relation Age of Onset  . Diabetes Mother   . Hyperlipidemia Father   . Stroke Paternal Grandfather   . Diabetes Paternal Grandfather   . Cancer Neg Hx     Social History Social History   Tobacco Use  . Smoking status: Never Smoker  . Smokeless tobacco: Never Used  Substance Use Topics  . Alcohol use: Yes    Comment: Drinks occasiionally on weekends   . Drug use: No     Allergies   Patient has no known allergies.   Review of Systems Review of Systems  Constitutional: Negative for chills, fever and weight loss.  HENT: Negative for ear pain and sore throat.   Eyes: Negative for pain and visual disturbance.  Respiratory: Negative for cough and shortness of breath.   Cardiovascular: Negative for chest pain and palpitations.  Gastrointestinal: Negative for abdominal pain, bowel incontinence and vomiting.  Genitourinary: Positive for pelvic pain. Negative for bladder incontinence, dysuria and hematuria.  Musculoskeletal: Positive for back pain. Negative for arthralgias.  Skin: Negative for color change and rash.  Neurological: Negative for tingling, seizures, syncope,  weakness, numbness, headaches and paresthesias.  All other systems reviewed and are negative.    Physical Exam Triage Vital Signs ED Triage Vitals  Enc Vitals Group     BP 02/14/19 1803 (!) 110/58     Pulse Rate 02/14/19 1803 61     Resp 02/14/19 1803 18     Temp 02/14/19 1803 98.6 F (37 C)     Temp Source 02/14/19 1803 Oral     SpO2 02/14/19 1803 98 %     Weight 02/14/19 1804 121 lb (54.9 kg)     Height 02/14/19 1804 5\' 1"  (1.549 m)     Head Circumference --      Peak Flow --  Pain Score 02/14/19 1804 7     Pain Loc --      Pain Edu? --      Excl. in GC? --    No data found.  Updated Vital Signs BP (!) 110/58 (BP Location: Right Arm)   Pulse 61   Temp 98.6 F (37 C) (Oral)   Resp 18   Ht 5\' 1"  (1.549 m)   Wt 121 lb (54.9 kg)   LMP 01/28/2019 (Approximate)   SpO2 98%   BMI 22.86 kg/m   Visual Acuity Right Eye Distance:   Left Eye Distance:   Bilateral Distance:    Right Eye Near:   Left Eye Near:    Bilateral Near:     Physical Exam Vitals and nursing note reviewed.  Constitutional:      General: She is not in acute distress.    Appearance: She is well-developed.  HENT:     Head: Normocephalic and atraumatic.  Eyes:     Conjunctiva/sclera: Conjunctivae normal.  Cardiovascular:     Rate and Rhythm: Normal rate and regular rhythm.     Heart sounds: No murmur.  Pulmonary:     Effort: Pulmonary effort is normal. No respiratory distress.     Breath sounds: Normal breath sounds.  Abdominal:     Palpations: Abdomen is soft.     Tenderness: There is no abdominal tenderness. There is no right CVA tenderness or left CVA tenderness.  Musculoskeletal:     Cervical back: Normal and neck supple.     Thoracic back: Normal.     Lumbar back: Tenderness present.       Back:     Comments: Tenderness over the left SI joint.  There is no midline tenderness of the cervical, thoracic or lumbar spine.  Skin:    General: Skin is warm and dry.  Neurological:      Mental Status: She is alert.      UC Treatments / Results  Labs (all labs ordered are listed, but only abnormal results are displayed) Labs Reviewed  POCT URINALYSIS DIP (MANUAL ENTRY) - Abnormal; Notable for the following components:      Result Value   Blood, UA trace-intact (*)    All other components within normal limits    EKG   Radiology No results found.  Procedures Procedures (including critical care time)  Medications Ordered in UC Medications - No data to display  Initial Impression / Assessment and Plan / UC Course  I have reviewed the triage vital signs and the nursing notes.  Pertinent labs & imaging results that were available during my care of the patient were reviewed by me and considered in my medical decision making (see chart for details).     Patient has had a few days of left-sided SI joint pain that radiates to the left leg.  States that she also had an episode of dyspareunia.  She is concerned about the possibility of a kidney stone.  Patient does have trace blood noted in her urine today.  Discussed with the patient that I believe she likely has sciatica and recommended treatment with steroids and follow-up with primary care for further evaluation of dyspareunia.  Patient would feel more comfortable being evaluated tonight in the emergency room for imaging to rule out a possible kidney stone.  She has been given a prescription for steroids pending ER evaluation. Final Clinical Impressions(s) / UC Diagnoses   Final diagnoses:  Acute left-sided low back pain with left-sided  sciatica  Dyspareunia in female  Microscopic hematuria   Discharge Instructions   None    ED Prescriptions    Medication Sig Dispense Auth. Provider   predniSONE (STERAPRED UNI-PAK 21 TAB) 10 MG (21) TBPK tablet Take by mouth daily. Take 6 tabs by mouth daily  for 2 days, then 5 tabs for 2 days, then 4 tabs for 2 days, then 3 tabs for 2 days, 2 tabs for 2 days, then 1 tab by  mouth daily for 2 days 42 tablet Dilraj Killgore C, PA-C     PDMP not reviewed this encounter.   Alecia Lemming, [place] 02/14/19 1823

## 2019-02-14 NOTE — Telephone Encounter (Signed)
Just need to confirm with patient that she cannot have 2 PCP who would she like to establish with.ok to see her again but needs to pick a PCP to manage.

## 2019-02-14 NOTE — ED Triage Notes (Signed)
Pt c/o lower back pain x 2 weeks. Worsening over the last day. Radiating down LT leg. Was treated in January for possible UTI, blood in urine. Was told it could also be kidney stones but never had any imaging done. Pain 7/10

## 2019-02-17 NOTE — Telephone Encounter (Signed)
Patient has been made aware. No further questions at this time.  

## 2019-12-15 ENCOUNTER — Other Ambulatory Visit: Payer: Self-pay

## 2019-12-15 ENCOUNTER — Encounter: Payer: Self-pay | Admitting: Emergency Medicine

## 2019-12-15 ENCOUNTER — Emergency Department
Admission: EM | Admit: 2019-12-15 | Discharge: 2019-12-15 | Disposition: A | Discharge status: Home / Self Care | Payer: BC Managed Care – PPO | Source: Home / Self Care

## 2019-12-15 ENCOUNTER — Emergency Department (INDEPENDENT_AMBULATORY_CARE_PROVIDER_SITE_OTHER): Payer: BC Managed Care – PPO

## 2019-12-15 DIAGNOSIS — R1031 Right lower quadrant pain: Secondary | ICD-10-CM

## 2019-12-15 DIAGNOSIS — R1024 Suprapubic pain: Secondary | ICD-10-CM

## 2019-12-15 DIAGNOSIS — R109 Unspecified abdominal pain: Secondary | ICD-10-CM

## 2019-12-15 DIAGNOSIS — R1084 Generalized abdominal pain: Secondary | ICD-10-CM

## 2019-12-15 DIAGNOSIS — R102 Pelvic and perineal pain: Secondary | ICD-10-CM

## 2019-12-15 DIAGNOSIS — R10A1 Flank pain, right side: Secondary | ICD-10-CM

## 2019-12-15 DIAGNOSIS — N3001 Acute cystitis with hematuria: Secondary | ICD-10-CM | POA: Diagnosis not present

## 2019-12-15 LAB — POCT URINALYSIS DIP (MANUAL ENTRY)
Bilirubin, UA: NEGATIVE
Glucose, UA: NEGATIVE mg/dL
Ketones, POC UA: NEGATIVE mg/dL
Nitrite, UA: NEGATIVE
Protein Ur, POC: NEGATIVE mg/dL
Spec Grav, UA: 1.01 (ref 1.010–1.025)
Urobilinogen, UA: 0.2 E.U./dL
pH, UA: 6.5 (ref 5.0–8.0)

## 2019-12-15 LAB — POCT CBC W AUTO DIFF (K'VILLE URGENT CARE)

## 2019-12-15 LAB — POCT URINE PREGNANCY: Preg Test, Ur: NEGATIVE

## 2019-12-15 MED ORDER — IOHEXOL 300 MG/ML  SOLN
100.0000 mL | Freq: Once | INTRAMUSCULAR | Status: AC | PRN
  Administered 2019-12-15: 100 mL via INTRAVENOUS

## 2019-12-15 MED ORDER — CEPHALEXIN 500 MG PO CAPS: 500.0000 mg | ORAL_CAPSULE | Freq: Two times a day (BID) | ORAL | 0 refills | Status: AC

## 2019-12-15 NOTE — Discharge Instructions (Signed)
  Please take your antibiotic as prescribed. A urine culture has been sent to check the severity of your urinary infection and to determine if you are on the most appropriate antibiotic. The results should come back within 2-3 days. You will only be notified if a medication change is indicated.  Please follow up with family medicine or urology if not improving within 1 week, sooner if symptoms worsening.   

## 2019-12-15 NOTE — ED Triage Notes (Signed)
Dysuria on & off x 1 week - took AZO last week  No AZO today - increased pain w/ R flank pain over the weekend Also c/o burning w/ urination and bladder pressure Pf[employer] in April 2021

## 2019-12-15 NOTE — ED Provider Notes (Signed)
[facility]    CSN: [id number] Arrival date & time: 12/15/19  2706      History   Chief Complaint Chief Complaint  Patient presents with  . Dysuria    HPI Carol Silva is a 49 y.o. female.   HPI  Carol Silva is a 49 y.o. female presenting to UC with c/o intermittent dysuria for about 1 week. She took two days of Azo last week, left over from her husband who was recently dx with a kidney stone. Pt states the Azo provided temporary relief. Today she has continued dysuria, Right flank pain, urgency, frequency and bladder pressure. Denies fever, chills, n/v/d. Hx of UTI about 1 year ago. No prior hx of kidney stones but pt is concerned she may have one.    Past Medical History:  Diagnosis Date  . ADD (attention deficit disorder) 09/01/2011  . GERD (gastroesophageal reflux disease) 09/01/2011    Patient Active Problem List   Diagnosis Date Noted  . Fibrocystic breast changes 01/30/2018  . Epigastric abdominal pain 02/25/2017  . RUQ pain 02/25/2017  . Bloating 02/25/2017  . Flatulence 02/25/2017  . Weight gain 05/19/2016  . Decreased libido 05/17/2016  . Hot flashes 05/17/2016  . Mood changes 05/17/2016  . No energy 05/17/2016  . SAB (spontaneous abortion) 05/24/2015  . Abdominal pain 04/26/2015  . Positive pregnancy test 04/26/2015  . Allergic conjunctivitis 04/06/2014  . Breast mass, right 04/06/2014  . Heavy period 01/19/2013  . Acne 01/19/2013  . Left breast mass 12/19/2012  . Right knee pain 07/05/2012  . GERD (gastroesophageal reflux disease) 09/01/2011  . ADD (attention deficit disorder) 09/01/2011    Past Surgical History:  Procedure Laterality Date  . BREAST LUMPECTOMY WITH RADIOACTIVE SEED LOCALIZATION Right 05/26/2014   Procedure: BREAST LUMPECTOMY WITH RADIOACTIVE SEED LOCALIZATION;  Surgeon: Abigail Miyamoto, MD;  Location: [facility];  Service: General;  Laterality: Right;  . BREAST SURGERY     breast implants  bilaterally-   . CESAREAN SECTION    . DILATION AND EVACUATION N/A 05/24/2015   Procedure: DILATATION AND EVACUATION;  Surgeon: Levie Heritage, DO;  Location: [facility];  Service: Gynecology;  Laterality: N/A;    OB History    Gravida  2   Para  1   Term  0   Preterm  1   AB  0   Living  2     SAB  0   TAB  0   Ectopic  0   Multiple  1   Live Births  1            Home Medications    Prior to Admission medications   Medication Sig Start Date End Date Taking? Authorizing Provider  aspirin-acetaminophen-caffeine (EXCEDRIN MIGRAINE) [phone] MG tablet Take 1-2 tabes every 6-8 hours as needed for pain. 10/24/16   Lurene Shadow, PA-C  cephALEXin (KEFLEX) 500 MG capsule Take 1 capsule (500 mg total) by mouth 2 (two) times daily for 7 days. 12/15/19 12/22/19  Lurene Shadow, PA-C  fluticasone (FLONASE) 50 MCG/ACT nasal spray Place 2 sprays into both nostrils daily. Patient not taking: Reported on 12/15/2019 10/12/17   Elvina Sidle, MD  HYDROcodone-homatropine (HYDROMET) 5-1.5 MG/5ML syrup Take 5 mLs by mouth every 6 (six) hours as needed for cough. Patient not taking: Reported on 12/15/2019 10/12/17   Elvina Sidle, MD  omeprazole (PRILOSEC) 40 MG capsule TAKE 1 CAPSULE BY MOUTH EVERY DAY Patient not taking: Reported on 12/15/2019  10/10/17   Breeback, Jade L, PA-C  ondansetron (ZOFRAN) 4 MG tablet Take 1 tablet (4 mg total) by mouth every 6 (six) hours. Patient not taking: Reported on 12/15/2019 03/17/18   Lurene ShadowPhelps, Caron Ode O, PA-C  predniSONE (STERAPRED UNI-PAK 21 TAB) 10 MG (21) TBPK tablet Take by mouth daily. Take 6 tabs by mouth daily  for 2 days, then 5 tabs for 2 days, then 4 tabs for 2 days, then 3 tabs for 2 days, 2 tabs for 2 days, then 1 tab by mouth daily for 2 days Patient not taking: Reported on 12/15/2019 02/14/19   Alecia LemmingBlue, Olivia C, PA-C    Family History Family History  Problem Relation Age of Onset  . Diabetes Mother   . Hyperlipidemia Father   . Stroke  Paternal Grandfather   . Diabetes Paternal Grandfather   . Cancer Neg Hx     Social History Social History   Tobacco Use  . Smoking status: Never Smoker  . Smokeless tobacco: Never Used  Vaping Use  . Vaping Use: Never used  Substance Use Topics  . Alcohol use: Yes    Comment: Drinks occasiionally on weekends   . Drug use: No     Allergies   Patient has no known allergies.   Review of Systems Review of Systems  Constitutional: Negative for chills and fever.  Gastrointestinal: Positive for abdominal pain (bladder pressure). Negative for diarrhea, nausea and vomiting.  Genitourinary: Positive for dysuria, flank pain ( Right), frequency, pelvic pain and urgency. Negative for decreased urine volume, hematuria, vaginal bleeding, vaginal discharge and vaginal pain.  Neurological: Negative for dizziness, light-headedness and headaches.     Physical Exam Triage Vital Signs ED Triage Vitals  Enc Vitals Group     BP 12/15/19 0834 103/69     Pulse Rate 12/15/19 0834 69     Resp 12/15/19 0834 15     Temp 12/15/19 0834 98.7 F (37.1 C)     Temp Source 12/15/19 0834 Tympanic     SpO2 12/15/19 0834 98 %     Weight 12/15/19 0837 121 lb 4.1 oz (55 kg)     Height 12/15/19 0837 5\' 1"  (1.549 m)     Head Circumference --      Peak Flow --      Pain Score 12/15/19 0836 3     Pain Loc --      Pain Edu? --      Excl. in GC? --    No data found.  Updated Vital Signs BP 103/69 (BP Location: Right Arm)   Pulse 69   Temp 98.7 F (37.1 C) (Tympanic)   Resp 15   Ht 5\' 1"  (1.549 m)   Wt 121 lb 4.1 oz (55 kg)   LMP 12/03/2019 (Exact Date)   SpO2 98%   Breastfeeding No   BMI 22.91 kg/m   Visual Acuity Right Eye Distance:   Left Eye Distance:   Bilateral Distance:    Right Eye Near:   Left Eye Near:    Bilateral Near:     Physical Exam Vitals and nursing note reviewed.  Constitutional:      General: She is not in acute distress.    Appearance: Normal appearance. She is  well-developed. She is not ill-appearing, toxic-appearing or diaphoretic.  HENT:     Head: Normocephalic and atraumatic.  Cardiovascular:     Rate and Rhythm: Normal rate and regular rhythm.  Pulmonary:     Effort: Pulmonary effort is normal.  No respiratory distress.     Breath sounds: Normal breath sounds.  Abdominal:     General: There is no distension.     Palpations: Abdomen is soft. There is no mass.     Tenderness: There is abdominal tenderness (suprapubic). There is no right CVA tenderness, left CVA tenderness, guarding or rebound.  Musculoskeletal:        General: Normal range of motion.     Cervical back: Normal range of motion.  Skin:    General: Skin is warm and dry.  Neurological:     Mental Status: She is alert and oriented to person, place, and time.  Psychiatric:        Behavior: Behavior normal.      UC Treatments / Results  Labs (all labs ordered are listed, but only abnormal results are displayed) Labs Reviewed  POCT URINALYSIS DIP (MANUAL ENTRY) - Abnormal; Notable for the following components:      Result Value   Clarity, UA cloudy (*)    Blood, UA large (*)    Leukocytes, UA Small (1+) (*)    All other components within normal limits  URINE CULTURE  COMPLETE METABOLIC PANEL WITH GFR  POCT URINE PREGNANCY  POCT CBC W AUTO DIFF ([facility])    EKG   Radiology CT ABDOMEN PELVIS W CONTRAST  Result Date: 12/15/2019 CLINICAL DATA:  Right lower quadrant abdominal pain. Appendicitis suspected. EXAM: CT ABDOMEN AND PELVIS WITH CONTRAST TECHNIQUE: Multidetector CT imaging of the abdomen and pelvis was performed using the standard protocol following bolus administration of intravenous contrast. CONTRAST:  OMNIPAQUE IOHEXOL 300 MG/ML  SOLN COMPARISON:  Earlier same day FINDINGS: Lower chest: Normal Hepatobiliary: Few tiny liver cysts.  No calcified gallstones. Pancreas: Normal Spleen: Normal Adrenals/Urinary Tract: Adrenal glands are normal.  Kidneys are normal. Bladder is normal. Stomach/Bowel: The appendix appears normal on this exam. No evidence of proximal dilatation. Small appendicolith in the midportion as seen previously. Vascular/Lymphatic: Normal Reproductive: Normal Other: No free fluid or air. Musculoskeletal: Normal IMPRESSION: No acute finding by CT. No evidence of appendicitis. No dilatation or surrounding inflammatory change. Small appendicolith in the midportion as seen previously. Electronically Signed   By: Paulina Fusi M.D.   On: 12/15/2019 11:26   CT Renal Stone Study  Result Date: 12/15/2019 CLINICAL DATA:  Right flank pain EXAM: CT ABDOMEN AND PELVIS WITHOUT CONTRAST TECHNIQUE: Multidetector CT imaging of the abdomen and pelvis was performed following the standard protocol without IV contrast. COMPARISON:  None. FINDINGS: Lower chest: Lung bases are clear. No effusions. Heart is normal size. Hepatobiliary: 11 mm low-density lesion in the anterior left hepatic lobe compatible with small cyst, stable since prior study. Gallbladder contracted, grossly unremarkable. Pancreas: No focal abnormality or ductal dilatation. Spleen: No focal abnormality.  Normal size. Adrenals/Urinary Tract: No adrenal abnormality. No focal renal abnormality. No stones or hydronephrosis. Urinary bladder is unremarkable. Stomach/Bowel: Appendix is borderline in diameter measuring 8 mm in the proximal to mid appendix. There is an appendicolith within the mid appendix. Slight haziness noted around the proximal to mid appendix. The distal appendix appears more normal, with normal caliber and gas within the lumen. Stomach, large and small bowel grossly unremarkable. Vascular/Lymphatic: No evidence of aneurysm or adenopathy. Reproductive: Uterus and adnexa unremarkable.  No mass. Other: No free fluid or free air. Musculoskeletal: No acute bony abnormality. IMPRESSION: No renal or ureteral stones.  No hydronephrosis. Borderline diameter of the proximal to mid  appendix with small appendicolith and  slight haziness around the proximal to mid appendix. Distal appendix appears normal. Recommend clinical correlation to exclude early acute appendicitis. Electronically Signed   By: Charlett Nose M.D.   On: 12/15/2019 09:48    Procedures Procedures (including critical care time)  Medications Ordered in UC Medications - No data to display  Initial Impression / Assessment and Plan / UC Course  I have reviewed the triage vital signs and the nursing notes.  Pertinent labs & imaging results that were available during my care of the patient were reviewed by me and considered in my medical decision making (see chart for details).    Hx and UA c/w UTI Culture sent Pt concerned about kidney stone, CT stone study ordered. Discussed CT stone study, no evidence of renal or ureteral stones, questionable early appendicitis CBC: WNL Discussed starting tx for suspected acute cystitis and close f/u vs CT w/ contrast today, pt would like to proceed with CT contrast for further evaluation of appendix.   CT abd w/ contrast: No evidence of acute appendicitis  Reassured pt Rx: keflex F/u with PCP  AVS given  Final Clinical Impressions(s) / UC Diagnoses   Final diagnoses:  Acute cystitis with hematuria  RLQ abdominal pain  Acute suprapubic pain  Right flank pain     Discharge Instructions      Please take your antibiotic as prescribed. A urine culture has been sent to check the severity of your urinary infection and to determine if you are on the most appropriate antibiotic. The results should come back within 2-3 days. You will only be notified if a medication change is indicated.  Please follow up with family medicine or urology if not improving within 1 week, sooner if symptoms worsening.      ED Prescriptions    Medication Sig Dispense Auth. Provider   cephALEXin (KEFLEX) 500 MG capsule Take 1 capsule (500 mg total) by mouth 2 (two) times daily for 7  days. 14 capsule Lurene Shadow, [place]     PDMP not reviewed this encounter.   Lurene Shadow, PA-C 12/15/19 1406

## 2019-12-16 LAB — COMPLETE METABOLIC PANEL WITH GFR
AG Ratio: 1.6 (calc) (ref 1.0–2.5)
ALT: 9 U/L (ref 6–29)
AST: 13 U/L (ref 10–35)
Albumin: 4 g/dL (ref 3.6–5.1)
Alkaline phosphatase (APISO): 80 U/L (ref 31–125)
BUN: 14 mg/dL (ref 7–25)
CO2: 23 mmol/L (ref 20–32)
Calcium: 8.9 mg/dL (ref 8.6–10.2)
Chloride: 106 mmol/L (ref 98–110)
Creat: 0.54 mg/dL (ref 0.50–1.10)
GFR, Est African American: 130 mL/min/{1.73_m2} (ref >=60)
GFR, Est Non African American: 112 mL/min/{1.73_m2} (ref >=60)
Globulin: 2.5 g/dL (calc) (ref 1.9–3.7)
Glucose, Bld: 95 mg/dL (ref 65–99)
Potassium: 4.1 mmol/L (ref 3.5–5.3)
Sodium: 137 mmol/L (ref 135–146)
Total Bilirubin: 0.3 mg/dL (ref 0.2–1.2)
Total Protein: 6.5 g/dL (ref 6.1–8.1)

## 2019-12-17 LAB — URINE CULTURE
MICRO NUMBER:: 11280662
Result:: NO GROWTH
SPECIMEN QUALITY:: ADEQUATE

## 2020-01-04 ENCOUNTER — Emergency Department (HOSPITAL_BASED_OUTPATIENT_CLINIC_OR_DEPARTMENT_OTHER)
Admission: EM | Admit: 2020-01-04 | Discharge: 2020-01-04 | Disposition: A | Discharge status: Home / Self Care | Payer: BC Managed Care – PPO | Attending: Emergency Medicine | Admitting: Emergency Medicine

## 2020-01-04 DIAGNOSIS — X58XXXA Exposure to other specified factors, initial encounter: Secondary | ICD-10-CM | POA: Diagnosis not present

## 2020-01-04 DIAGNOSIS — T1502XA Foreign body in cornea, left eye, initial encounter: Secondary | ICD-10-CM | POA: Diagnosis not present

## 2020-01-04 DIAGNOSIS — Z7982 Long term (current) use of aspirin: Secondary | ICD-10-CM | POA: Diagnosis not present

## 2020-01-04 DIAGNOSIS — H5712 Ocular pain, left eye: Secondary | ICD-10-CM | POA: Diagnosis present

## 2020-01-04 DIAGNOSIS — H579 Unspecified disorder of eye and adnexa: Secondary | ICD-10-CM

## 2020-01-04 MED ORDER — TETRACAINE HCL 0.5 % OP SOLN
2.0000 [drp] | Freq: Once | OPHTHALMIC | Status: AC
  Administered 2020-01-04: 2 [drp] via OPHTHALMIC
  Filled 2020-01-04: qty 4

## 2020-01-04 MED ORDER — CIPROFLOXACIN HCL 0.3 % OP SOLN: OPHTHALMIC | 0 refills | Status: AC

## 2020-01-04 MED ORDER — FLUORESCEIN SODIUM 1 MG OP STRP
1.0000 | ORAL_STRIP | Freq: Once | OPHTHALMIC | Status: AC
  Administered 2020-01-04: 1 via OPHTHALMIC
  Filled 2020-01-04: qty 1

## 2020-01-04 NOTE — Discharge Instructions (Signed)
Return for change in vision.  Follow up with your eye doc.

## 2020-01-04 NOTE — ED Provider Notes (Signed)
[facility] EMERGENCY DEPARTMENT Provider Note   CSN: [id number] Arrival date & time: 01/04/20  1252     History Chief Complaint  Patient presents with  . Eye Pain    Carol Silva is a 49 y.o. female.  49 yo F with a chief complaint of pain to the left eye.  This was noted about 3 to 4 days ago when she tried to take her contact out.  Feels like something is stuck in her eye.  Denies woodworking or metal working.  Denies direct trauma to the eye other than taking her contact out.  Does not think that her contact was fractured.  Has had some watery drainage.  Has tried flushing it out with some worsening.  No change to her vision.  Mild photophobia.  No pain with light to the other eye.  The history is provided by the patient.  Eye Pain This is a new problem. The current episode started more than 2 days ago. The problem occurs constantly. The problem has been gradually worsening. Pertinent negatives include no chest pain, no headaches and no shortness of breath. Nothing aggravates the symptoms. Nothing relieves the symptoms. She has tried nothing for the symptoms. The treatment provided no relief.       Past Medical History:  Diagnosis Date  . ADD (attention deficit disorder) 09/01/2011  . GERD (gastroesophageal reflux disease) 09/01/2011    Patient Active Problem List   Diagnosis Date Noted  . Fibrocystic breast changes 01/30/2018  . Epigastric abdominal pain 02/25/2017  . RUQ pain 02/25/2017  . Bloating 02/25/2017  . Flatulence 02/25/2017  . Weight gain 05/19/2016  . Decreased libido 05/17/2016  . Hot flashes 05/17/2016  . Mood changes 05/17/2016  . No energy 05/17/2016  . SAB (spontaneous abortion) 05/24/2015  . Abdominal pain 04/26/2015  . Positive pregnancy test 04/26/2015  . Allergic conjunctivitis 04/06/2014  . Breast mass, right 04/06/2014  . Heavy period 01/19/2013  . Acne 01/19/2013  . Left breast mass 12/19/2012  . Right knee pain 07/05/2012   . GERD (gastroesophageal reflux disease) 09/01/2011  . ADD (attention deficit disorder) 09/01/2011    Past Surgical History:  Procedure Laterality Date  . BREAST LUMPECTOMY WITH RADIOACTIVE SEED LOCALIZATION Right 05/26/2014   Procedure: BREAST LUMPECTOMY WITH RADIOACTIVE SEED LOCALIZATION;  Surgeon: Abigail Miyamoto, MD;  Location: [facility];  Service: General;  Laterality: Right;  . BREAST SURGERY     breast implants bilaterally-   . CESAREAN SECTION    . DILATION AND EVACUATION N/A 05/24/2015   Procedure: DILATATION AND EVACUATION;  Surgeon: Levie Heritage, DO;  Location: [facility];  Service: Gynecology;  Laterality: N/A;     OB History    Gravida  2   Para  1   Term  0   Preterm  1   AB  0   Living  2     SAB  0   IAB  0   Ectopic  0   Multiple  1   Live Births  1           Family History  Problem Relation Age of Onset  . Diabetes Mother   . Hyperlipidemia Father   . Stroke Paternal Grandfather   . Diabetes Paternal Grandfather   . Cancer Neg Hx     Social History   Tobacco Use  . Smoking status: Never Smoker  . Smokeless tobacco: Never Used  Vaping Use  . Vaping Use: Never used  Substance Use Topics  . Alcohol use: Yes    Comment: Drinks occasiionally on weekends   . Drug use: No    Home Medications Prior to Admission medications   Medication Sig Start Date End Date Taking? Authorizing Provider  aspirin-acetaminophen-caffeine (EXCEDRIN MIGRAINE) [phone] MG tablet Take 1-2 tabes every 6-8 hours as needed for pain. 10/24/16   Lurene Shadow, PA-C  ciprofloxacin (CILOXAN) 0.3 % ophthalmic solution Administer 1 drop, every 2 hours, while awake, for 2 days. Then 1 drop, every 4 hours, while awake, for the next 5 days. 01/04/20   Melene Plan, DO  fluticasone (FLONASE) 50 MCG/ACT nasal spray Place 2 sprays into both nostrils daily. Patient not taking: No sig reported 10/12/17   Elvina Sidle, MD  HYDROcodone-homatropine  (HYDROMET) 5-1.5 MG/5ML syrup Take 5 mLs by mouth every 6 (six) hours as needed for cough. Patient not taking: No sig reported 10/12/17   Elvina Sidle, MD  omeprazole (PRILOSEC) 40 MG capsule TAKE 1 CAPSULE BY MOUTH EVERY DAY Patient not taking: No sig reported 10/10/17   Breeback, Jade L, PA-C  ondansetron (ZOFRAN) 4 MG tablet Take 1 tablet (4 mg total) by mouth every 6 (six) hours. Patient not taking: No sig reported 03/17/18   Lurene Shadow, PA-C  predniSONE (STERAPRED UNI-PAK 21 TAB) 10 MG (21) TBPK tablet Take by mouth daily. Take 6 tabs by mouth daily  for 2 days, then 5 tabs for 2 days, then 4 tabs for 2 days, then 3 tabs for 2 days, 2 tabs for 2 days, then 1 tab by mouth daily for 2 days Patient not taking: No sig reported 02/14/19   Blue, Olivia C, PA-C    Allergies    Patient has no known allergies.  Review of Systems   Review of Systems  Constitutional: Negative for chills and fever.  HENT: Negative for congestion and rhinorrhea.   Eyes: Positive for pain and discharge. Negative for redness and visual disturbance.  Respiratory: Negative for shortness of breath and wheezing.   Cardiovascular: Negative for chest pain and palpitations.  Gastrointestinal: Negative for nausea and vomiting.  Genitourinary: Negative for dysuria and urgency.  Musculoskeletal: Negative for arthralgias and myalgias.  Skin: Negative for pallor and wound.  Neurological: Negative for dizziness and headaches.    Physical Exam Updated Vital Signs BP 108/68 (BP Location: Right Arm)   Pulse 75   Temp 98.7 F (37.1 C) (Oral)   Resp 16   Ht 5\' 1"  (1.549 m)   Wt 55.8 kg   LMP 01/01/2020   SpO2 100%   BMI 23.24 kg/m   Physical Exam Vitals and nursing note reviewed.  Constitutional:      General: She is not in acute distress.    Appearance: She is well-developed and well-nourished. She is not diaphoretic.  HENT:     Head: Normocephalic and atraumatic.  Eyes:     General:        Left eye: No  foreign body.     Extraocular Movements: EOM normal.     Conjunctiva/sclera:     Left eye: Left conjunctiva is injected. Exudate (watery) present.     Pupils: Pupils are equal, round, and reactive to light.     Left eye: Fluorescein uptake (small area of uptake to the 5 o clock position) present.  Cardiovascular:     Rate and Rhythm: Normal rate and regular rhythm.     Heart sounds: No murmur heard. No friction rub. No gallop.   Pulmonary:  Effort: Pulmonary effort is normal.     Breath sounds: No wheezing or rales.  Abdominal:     General: There is no distension.     Palpations: Abdomen is soft.     Tenderness: There is no abdominal tenderness.  Musculoskeletal:        General: No tenderness or edema.     Cervical back: Normal range of motion and neck supple.  Skin:    General: Skin is warm and dry.  Neurological:     Mental Status: She is alert and oriented to person, place, and time.  Psychiatric:        Mood and Affect: Mood and affect normal.        Behavior: Behavior normal.     ED Results / Procedures / Treatments   Labs (all labs ordered are listed, but only abnormal results are displayed) Labs Reviewed - No data to display  EKG None  Radiology No results found.  Procedures Procedures (including critical care time)  Medications Ordered in ED Medications  tetracaine (PONTOCAINE) 0.5 % ophthalmic solution 2 drop (2 drops Left Eye Given 01/04/20 1601)  fluorescein ophthalmic strip 1 strip (1 strip Left Eye Given 01/04/20 1601)    ED Course  I have reviewed the triage vital signs and the nursing notes.  Pertinent labs & imaging results that were available during my care of the patient were reviewed by me and considered in my medical decision making (see chart for details).    MDM Rules/Calculators/A&P                          49 yo F with a cc of L eye pain after removing her contact lens about 5 days ago.  On exam likely corneal abrasion.  No FB.   D/c home.  Optho follow up.   7:04 PM:  I have discussed the diagnosis/risks/treatment options with the patient and believe the pt to be eligible for discharge home to follow-up with Ophtho. We also discussed returning to the ED immediately if new or worsening sx occur. We discussed the sx which are most concerning (e.g., sudden worsening pain,change in vision) that necessitate immediate return. Medications administered to the patient during their visit and any new prescriptions provided to the patient are listed below.  Medications given during this visit Medications  tetracaine (PONTOCAINE) 0.5 % ophthalmic solution 2 drop (2 drops Left Eye Given 01/04/20 1601)  fluorescein ophthalmic strip 1 strip (1 strip Left Eye Given 01/04/20 1601)     The patient appears reasonably screen and/or stabilized for discharge and I doubt any other medical condition or other [facility] requiring further screening, evaluation, or treatment in the ED at this time prior to discharge.   Final Clinical Impression(s) / ED Diagnoses Final diagnoses:  Sensation of foreign body in eye    Rx / DC Orders ED Discharge Orders         Ordered    ciprofloxacin (CILOXAN) 0.3 % ophthalmic solution        01/04/20 1644           Melene Plan, DO 01/04/20 1904

## 2020-01-04 NOTE — ED Triage Notes (Signed)
Pt states she was taking out contact lens on Thursday. States eye is burning and tearing more. Sensitivity to light.

## 2020-01-04 NOTE — ED Notes (Signed)
ED Provider at bedside. 

## 2020-01-08 ENCOUNTER — Other Ambulatory Visit: Payer: Self-pay | Admitting: Family Medicine

## 2020-01-08 DIAGNOSIS — N6320 Unspecified lump in the left breast, unspecified quadrant: Secondary | ICD-10-CM

## 2020-01-08 DIAGNOSIS — N644 Mastodynia: Secondary | ICD-10-CM

## 2020-03-02 ENCOUNTER — Ambulatory Visit
Admission: RE | Admit: 2020-03-02 | Discharge: 2020-03-02 | Disposition: A | Discharge status: Home / Self Care | Payer: BC Managed Care – PPO | Source: Ambulatory Visit | Attending: Family Medicine | Admitting: Family Medicine

## 2020-03-02 ENCOUNTER — Other Ambulatory Visit: Payer: Self-pay | Admitting: Family Medicine

## 2020-03-02 ENCOUNTER — Other Ambulatory Visit: Payer: BC Managed Care – PPO

## 2020-03-02 ENCOUNTER — Other Ambulatory Visit: Payer: Self-pay

## 2020-03-02 DIAGNOSIS — N6320 Unspecified lump in the left breast, unspecified quadrant: Secondary | ICD-10-CM

## 2020-03-02 DIAGNOSIS — N644 Mastodynia: Secondary | ICD-10-CM

## 2021-01-12 IMAGING — MG DIGITAL DIAGNOSTIC BILATERAL MAMMOGRAM WITH IMPLANTS, CAD AND TO
8 of 16 series · 8 of 40 positions shown · non-contrast
Comparison: Previous exam(s).

CLINICAL DATA: Recent pain on the right. No pain today. The
patient's pain appears to be cyclical.

EXAM:
DIGITAL DIAGNOSTIC BILATERAL MAMMOGRAM WITH IMPLANTS, CAD AND TOMO
ULTRASOUND LEFT BREAST
The patient has retropectoral implants. Standard and implant
displaced views were performed.

[R MLO]
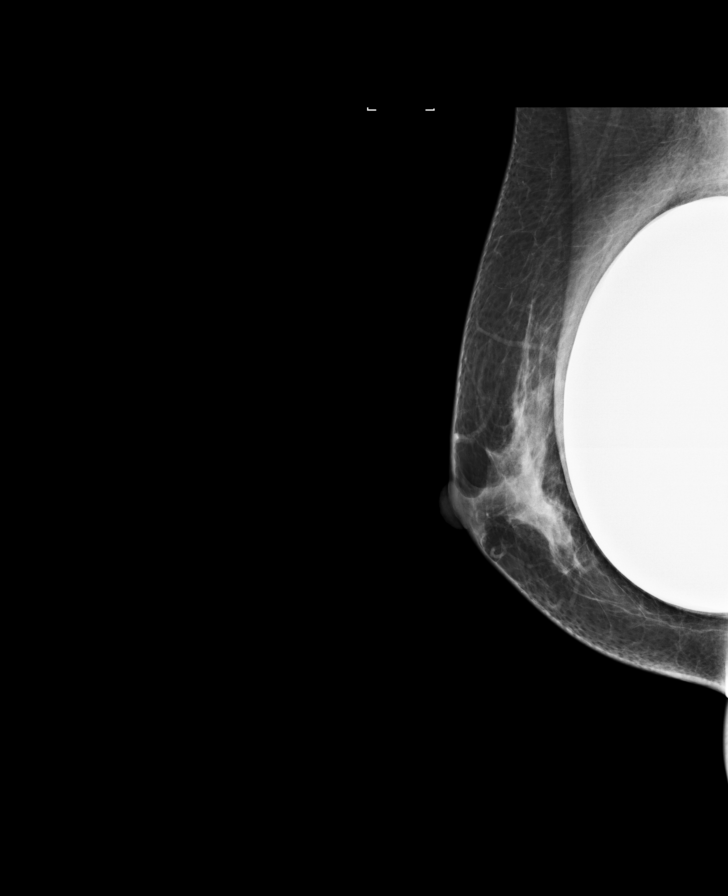

[R CC]
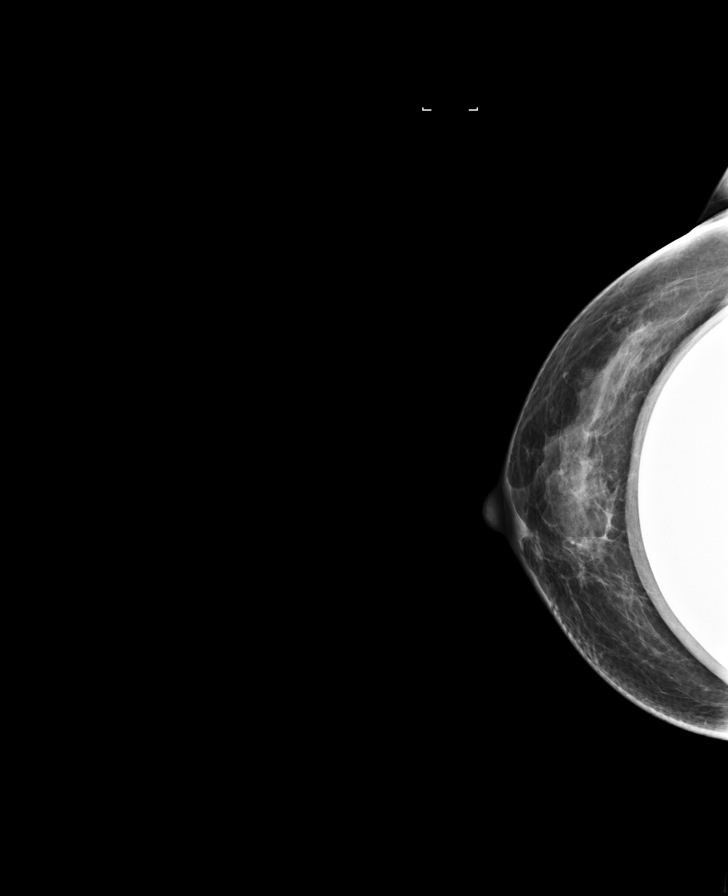

[L CC]
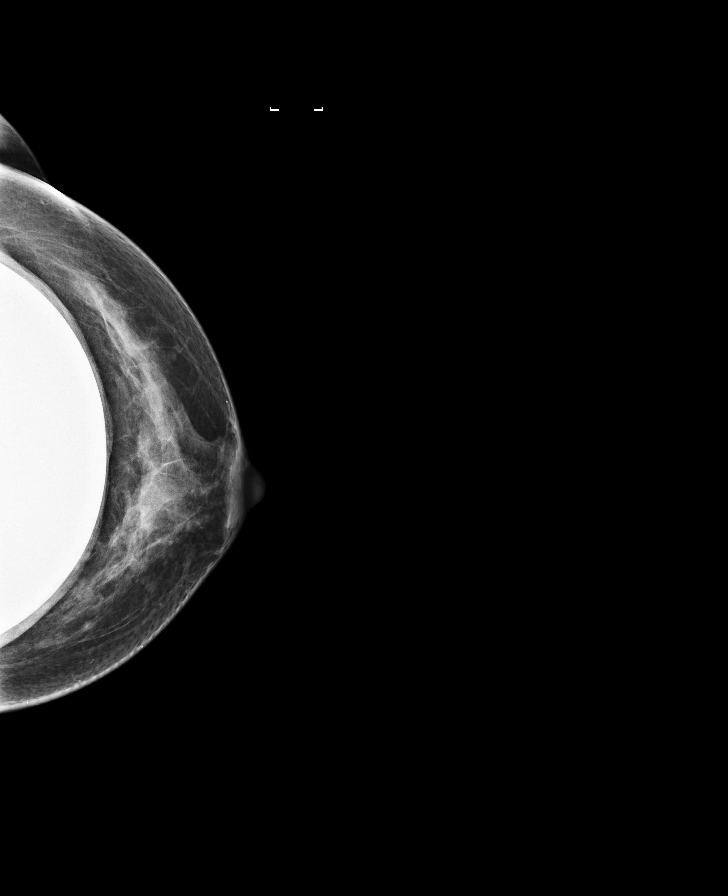

[L MLO]
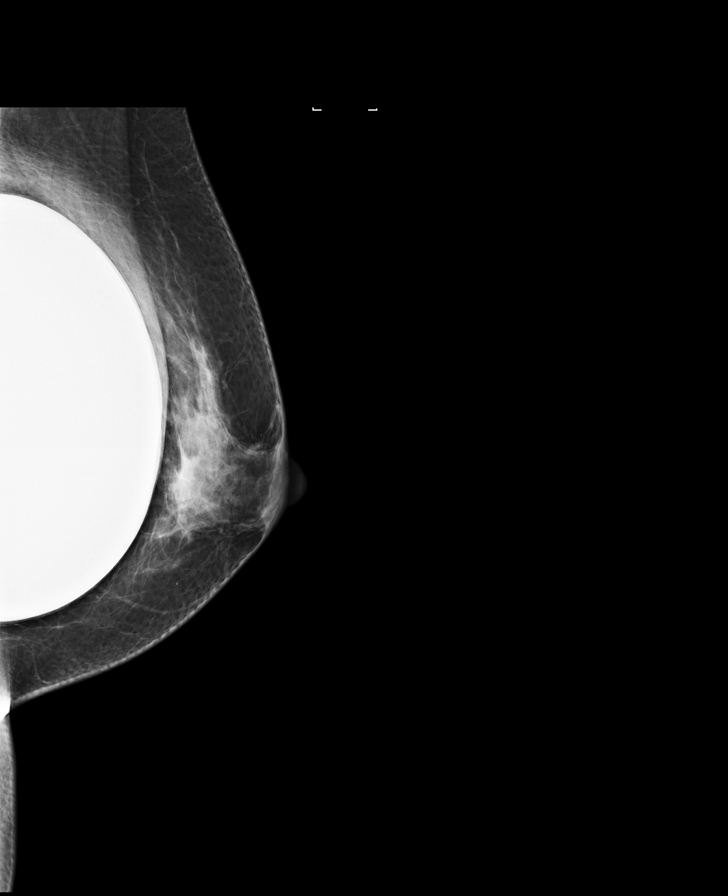

[R CC synth-2D]
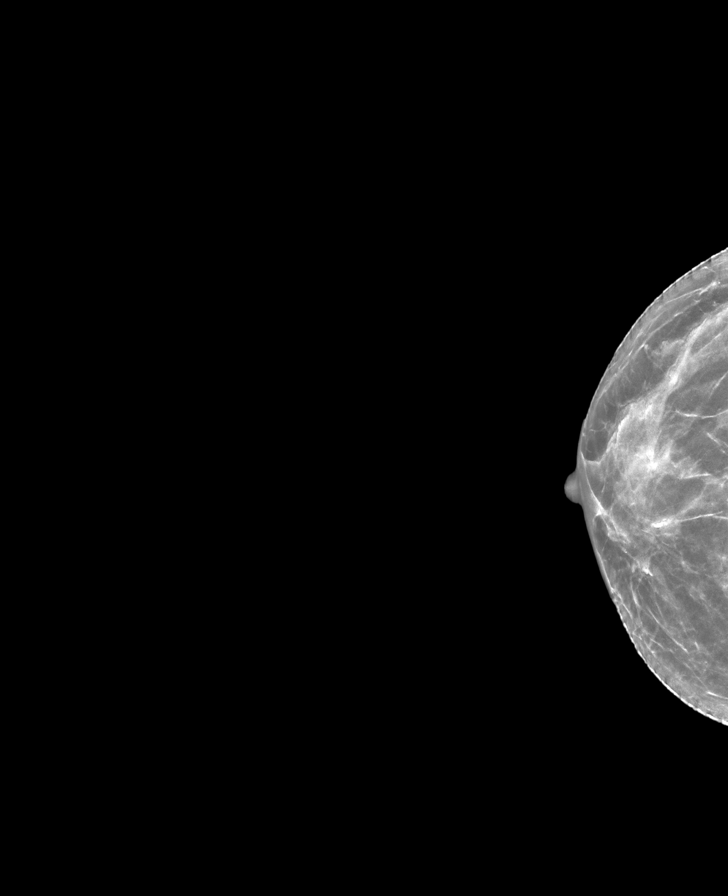

[L CC synth-2D]
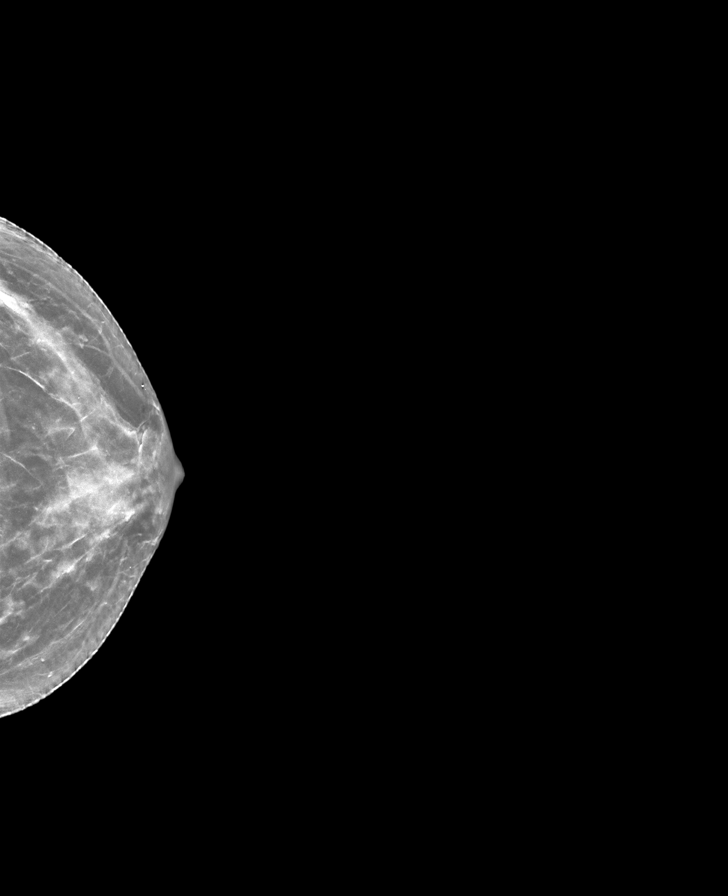

[R MLO synth-2D]
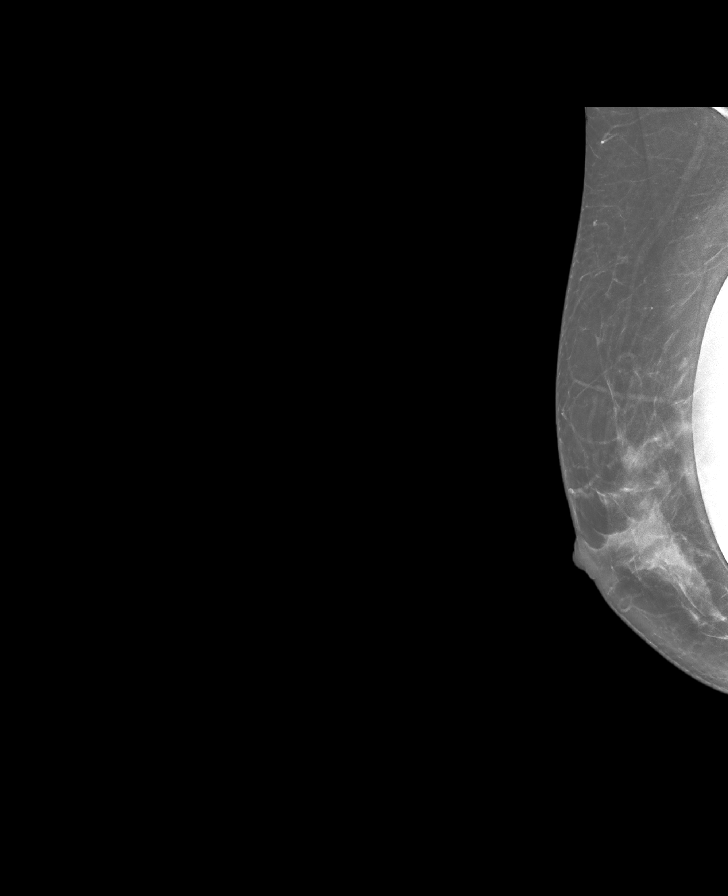

[L MLO synth-2D]
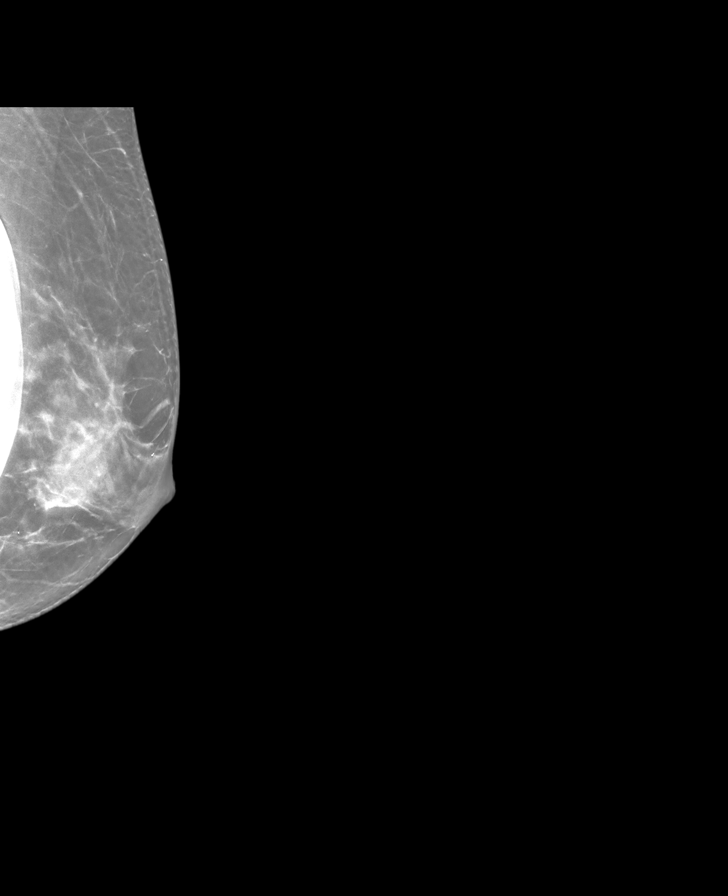

[8 of 40 positions shown; findings below may reference images not displayed]

ACR Breast Density Category c: The breast tissue is heterogeneously
dense, which may obscure small masses.
FINDINGS: An asymmetry in the lateral right breast resolves on additional
imaging. There appear to be masses in the upper outer left breast
posteriorly. No other evidence of malignancy in either breast.

On physical exam, no suspicious lumps are identified.

Targeted ultrasound is performed, showing numerous cysts in the
upper inner quadrant of the left breast. Ultrasound of the
upper-outer right breast demonstrates numerous cysts as well.
Sonographic findings correlate with mammographic findings.

Mammographic images were processed with CAD.
IMPRESSION: Fibrocystic changes.  No evidence of malignancy.

RECOMMENDATION:
Annual screening mammography.

I have discussed the findings and recommendations with the patient.
Results were also provided in writing at the conclusion of the
visit. If applicable, a reminder letter will be sent to the patient
regarding the next appointment.

BI-RADS CATEGORY  2: Benign.

## 2022-02-17 DIAGNOSIS — M7551 Bursitis of right shoulder: Secondary | ICD-10-CM | POA: Insufficient documentation

## 2022-04-07 ENCOUNTER — Ambulatory Visit
Admission: EM | Admit: 2022-04-07 | Discharge: 2022-04-07 | Disposition: A | Discharge status: Home / Self Care | Payer: BC Managed Care – PPO | Attending: Family Medicine | Admitting: Family Medicine

## 2022-04-07 ENCOUNTER — Encounter: Payer: Self-pay | Admitting: Emergency Medicine

## 2022-04-07 DIAGNOSIS — M26621 Arthralgia of right temporomandibular joint: Secondary | ICD-10-CM | POA: Diagnosis not present

## 2022-04-07 DIAGNOSIS — J322 Chronic ethmoidal sinusitis: Secondary | ICD-10-CM

## 2022-04-07 MED ORDER — AZITHROMYCIN 250 MG PO TABS: ORAL_TABLET | ORAL | 0 refills | Status: DC

## 2022-04-07 MED ORDER — FLUCONAZOLE 150 MG PO TABS: ORAL_TABLET | ORAL | 0 refills | Status: DC

## 2022-04-07 MED ORDER — PREDNISONE 20 MG PO TABS: ORAL_TABLET | ORAL | 0 refills | Status: DC

## 2022-04-07 NOTE — Discharge Instructions (Addendum)
Try taking Pseudoephedrine (30mg , one or two every 4 to 6 hours) for sinus congestion.   Begin taking Diflucan (fluconazole) if you get a vaginal yeast infection while taking antibiotic.   Put ice on your painful right TMJ: Put ice in a plastic bag. Place a towel between your skin and the bag. Leave the ice on for 10 to 15 minutes, 2-3 times a day. Remove the ice if your skin turns bright red. This is very important. If you cannot feel pain, heat, or cold, you have a greater risk of damage to the area.

## 2022-04-07 NOTE — ED Provider Notes (Signed)
[facility]    CSN: [id number] Arrival date & time: 04/07/22  1127      History   Chief Complaint Chief Complaint  Patient presents with   Otalgia    Right     HPI Carol Silva is a 52 y.o. female.   Patient complains of sinus congestion, facial pressure, and right side throat pain for three months, as well as intermittent right earache.  She states that she gets a sinus infection about every two years.  She notes that prednisone has improved her symptoms in the past.  She denies cough, fever, and URI symptoms.  The history is provided by the patient.    Past Medical History:  Diagnosis Date   ADD (attention deficit disorder) 09/01/2011   GERD (gastroesophageal reflux disease) 09/01/2011    Patient Active Problem List   Diagnosis Date Noted   Fibrocystic breast changes 01/30/2018   Epigastric abdominal pain 02/25/2017   RUQ pain 02/25/2017   Bloating 02/25/2017   Flatulence 02/25/2017   Weight gain 05/19/2016   Decreased libido 05/17/2016   Hot flashes 05/17/2016   Mood changes 05/17/2016   No energy 05/17/2016   SAB (spontaneous abortion) 05/24/2015   Abdominal pain 04/26/2015   Positive pregnancy test 04/26/2015   Allergic conjunctivitis 04/06/2014   Breast mass, right 04/06/2014   Heavy period 01/19/2013   Acne 01/19/2013   Left breast mass 12/19/2012   Right knee pain 07/05/2012   GERD (gastroesophageal reflux disease) 09/01/2011   ADD (attention deficit disorder) 09/01/2011    Past Surgical History:  Procedure Laterality Date   BREAST LUMPECTOMY WITH RADIOACTIVE SEED LOCALIZATION Right 05/26/2014   Procedure: BREAST LUMPECTOMY WITH RADIOACTIVE SEED LOCALIZATION;  Surgeon: Abigail Miyamoto, MD;  Location:  [facility];  Service: General;  Laterality: Right;   BREAST SURGERY     breast implants bilaterally-    CESAREAN SECTION     DILATION AND EVACUATION N/A 05/24/2015   Procedure: DILATATION AND EVACUATION;  Surgeon: Levie Heritage, DO;  Location: [facility];  Service: Gynecology;  Laterality: N/A;    OB History     Gravida  2   Para  1   Term  0   Preterm  1   AB  0   Living  2      SAB  0   IAB  0   Ectopic  0   Multiple  1   Live Births  1            Home Medications    Prior to Admission medications   Medication Sig Start Date End Date Taking? Authorizing Provider  azithromycin (ZITHROMAX Z-PAK) 250 MG tablet Take 2 tabs today; then begin one tab once daily for 4 more days. 04/07/22  Yes Lattie Haw, MD  fluconazole (DIFLUCAN) 150 MG tablet Take one tab PO as a single dose. May repeat in 72 hours. 04/07/22  Yes Lattie Haw, MD  predniSONE (DELTASONE) 20 MG tablet Take one tab by mouth twice daily for 4 days, then one daily for 3 days. Take with food. 04/07/22  Yes Lattie Haw, MD  aspirin-acetaminophen-caffeine (EXCEDRIN MIGRAINE) [phone] MG tablet Take 1-2 tabes every 6-8 hours as needed for pain. Patient not taking: Reported on 04/07/2022 10/24/16   Lurene Shadow, PA-C  ciprofloxacin (CILOXAN) 0.3 % ophthalmic solution Administer 1 drop, every 2 hours, while awake, for 2 days. Then 1 drop, every 4 hours, while awake, for the next 5  days. Patient not taking: Reported on 04/07/2022 01/04/20   Melene Plan, DO  fluticasone [facility]) 50 MCG/ACT nasal spray Place 2 sprays into both nostrils daily. Patient not taking: No sig reported 10/12/17   Elvina Sidle, MD  HYDROcodone-homatropine (HYDROMET) 5-1.5 MG/5ML syrup Take 5 mLs by mouth every 6 (six) hours as needed for cough. Patient not taking: No sig reported 10/12/17   Elvina Sidle, MD  omeprazole (PRILOSEC) 40 MG capsule TAKE 1 CAPSULE BY MOUTH EVERY DAY Patient not taking: No sig reported 10/10/17   Breeback, Jade L, PA-C  ondansetron (ZOFRAN) 4 MG tablet Take 1 tablet (4 mg total) by mouth every 6 (six) hours. Patient not taking: No sig reported 03/17/18   Lurene Shadow, PA-C  XIIDRA 5 % SOLN Apply 1 drop to eye 2  (two) times daily.    [provider]    Family History Family History  Problem Relation Age of Onset   Diabetes Mother    Hyperlipidemia Father    Stroke Paternal Grandfather    Diabetes Paternal Grandfather    Cancer Neg Hx     Social History Social History   Tobacco Use   Smoking status: Never   Smokeless tobacco: Never  Vaping Use   Vaping Use: Never used  Substance Use Topics   Alcohol use: Yes    Comment: social   Drug use: No     Allergies   Patient has no known allergies.   Review of Systems Review of Systems + sore throat No cough No pleuritic pain No wheezing + nasal congestion + post-nasal drainage + sinus pain/pressure No itchy/red eyes + right earache No hemoptysis No SOB No fever/chills No nausea No vomiting No abdominal pain No diarrhea No urinary symptoms No skin rash No fatigue No myalgias No headache   Physical Exam Triage Vital Signs ED Triage Vitals  Enc Vitals Group     BP 04/07/22 1141 102/70     Pulse Rate 04/07/22 1141 70     Resp 04/07/22 1141 16     Temp 04/07/22 1141 98.3 F (36.8 C)     Temp Source 04/07/22 1141 Oral     SpO2 04/07/22 1141 99 %     Weight 04/07/22 1145 125 lb (56.7 kg)     Height 04/07/22 1145 5\' 1"  (1.549 m)     Head Circumference --      Peak Flow --      Pain Score 04/07/22 1144 4     Pain Loc --      Pain Edu? --      Excl. in GC? --    No data found.  Updated Vital Signs BP 102/70 (BP Location: Right Arm)   Pulse 70   Temp 98.3 F (36.8 C) (Oral)   Resp 16   Ht 5\' 1"  (1.549 m)   Wt 56.7 kg   LMP 03/10/2022 (Exact Date)   SpO2 99%   BMI 23.62 kg/m   Visual Acuity Right Eye Distance:   Left Eye Distance:   Bilateral Distance:    Right Eye Near:   Left Eye Near:    Bilateral Near:     Physical Exam Nursing notes and Vital Signs reviewed. Appearance:  Patient appears stated age, and in no acute distress Eyes:  Pupils are equal, round, and reactive to light and  accomodation.  Extraocular movement is intact.  Conjunctivae are not inflamed  Ears:  Canals normal.  Tympanic membranes normal. There is distinct tenderness over  the right temporomandibular joint.  Palpation there recreates her pain.  There is mild tenderness over the post-auricular node. Nose:  Congested turbinates. Tenderness to palpation over bridge of nose. Pharynx:  Normal Neck:  Supple.  Mildly enlarged lateral nodes are present, tender to palpation on the left.   Lungs:  Clear to auscultation.  Breath sounds are equal.  Moving air well. Heart:  Regular rate and rhythm without murmurs, rubs, or gallops.  Abdomen:  Nontender without masses or hepatosplenomegaly.  Bowel sounds are present.  No CVA or flank tenderness.  Extremities:  No edema.  Skin:  No rash present.   UC Treatments / Results  Labs (all labs ordered are listed, but only abnormal results are displayed) Labs Reviewed - No data to display  EKG   Radiology No results found.  Procedures Procedures (including critical care time)  Medications Ordered in UC Medications - No data to display  Initial Impression / Assessment and Plan / UC Course  I have reviewed the triage vital signs and the nursing notes.  Pertinent labs & imaging results that were available during my care of the patient were reviewed by me and considered in my medical decision making (see chart for details).    Begin prednisone burst/taper.  Begin empiric Z-pack.  Rx for Diflucan at patient's request. Recommend follow-up with dentist for evaluation of right TMJ pain.  Final Clinical Impressions(s) / UC Diagnoses   Final diagnoses:  TMJ tenderness, right  Ethmoid sinusitis, unspecified chronicity     Discharge Instructions      Try taking Pseudoephedrine (30mg , one or two every 4 to 6 hours) for sinus congestion.   Begin taking Diflucan (fluconazole) if you get a vaginal yeast infection while taking antibiotic.   Put ice on your painful  right TMJ: Put ice in a plastic bag. Place a towel between your skin and the bag. Leave the ice on for 10 to 15 minutes, 2-3 times a day. Remove the ice if your skin turns bright red. This is very important. If you cannot feel pain, heat, or cold, you have a greater risk of damage to the area.    ED Prescriptions     Medication Sig Dispense Auth. Provider   azithromycin (ZITHROMAX Z-PAK) 250 MG tablet Take 2 tabs today; then begin one tab once daily for 4 more days. 6 tablet Lattie Haw, MD   predniSONE (DELTASONE) 20 MG tablet Take one tab by mouth twice daily for 4 days, then one daily for 3 days. Take with food. 11 tablet Lattie Haw, MD   fluconazole (DIFLUCAN) 150 MG tablet Take one tab PO as a single dose. May repeat in 72 hours. 2 tablet Lattie Haw, MD         Lattie Haw, MD 04/09/22 [phone]

## 2022-04-07 NOTE — ED Triage Notes (Signed)
Right ear pain intermittent x 1 month  R sided throat pain  Facial sinus pressure since January  Hx of sinus infection every year the last 2 years  No OTC meds  Prednisone has helped in the past

## 2022-05-22 DIAGNOSIS — M797 Fibromyalgia: Secondary | ICD-10-CM | POA: Insufficient documentation

## 2022-07-06 ENCOUNTER — Other Ambulatory Visit (HOSPITAL_BASED_OUTPATIENT_CLINIC_OR_DEPARTMENT_OTHER): Payer: Self-pay

## 2022-07-06 MED ORDER — ESTROGENS CONJUGATED 0.625 MG/GM VA CREA
1.0000 g | TOPICAL_CREAM | Freq: Every day | VAGINAL | 3 refills | Status: DC
  Filled 2022-07-06: qty 30, 30d supply, fill #0
  Filled 2022-07-31: qty 30, 30d supply, fill #1

## 2022-09-26 ENCOUNTER — Other Ambulatory Visit (HOSPITAL_BASED_OUTPATIENT_CLINIC_OR_DEPARTMENT_OTHER): Payer: Self-pay

## 2022-10-16 ENCOUNTER — Encounter: Payer: Self-pay | Admitting: Obstetrics & Gynecology

## 2022-10-16 ENCOUNTER — Ambulatory Visit: Payer: BC Managed Care – PPO | Admitting: Obstetrics & Gynecology

## 2022-10-16 VITALS — BP 101/63 | HR 65 | Ht 61.0 in | Wt 127.0 lb

## 2022-10-16 DIAGNOSIS — M797 Fibromyalgia: Secondary | ICD-10-CM | POA: Diagnosis not present

## 2022-10-16 DIAGNOSIS — N951 Menopausal and female climacteric states: Secondary | ICD-10-CM | POA: Diagnosis not present

## 2022-10-16 MED ORDER — NORETHIN ACE-ETH ESTRAD-FE 1-20 MG-MCG(24) PO TABS: 1.0000 | ORAL_TABLET | Freq: Every day | ORAL | 11 refills | Status: AC

## 2022-10-16 NOTE — Progress Notes (Signed)
   Subjective:    Patient ID: Carol Silva, female    DOB: 12/15/1970, 52 y.o.   MRN: [id number]  HPI  52 yo female presents for menopausal symptoms.  Pt had a miscarriage in 2017 and did not want to  LMP Sept 23, 2024; Pt has had irregular menses all year--skips 2-3 months at a time.  Pt wa given a vaginal cream--premarin--which she said made her bleed.  Other signs and symptoms--hot flashes, decreased libido, vaginal dryness, cycle irregularity, disrupted sleep, mental fog.  Pt had normal cycles once a month up until 2024.   Review of Systems     Objective:   Physical Exam Vitals reviewed.  Constitutional:      General: She is not in acute distress.    Appearance: She is well-developed.  HENT:     Head: Normocephalic and atraumatic.  Eyes:     Conjunctiva/sclera: Conjunctivae normal.  Cardiovascular:     Rate and Rhythm: Normal rate.  Pulmonary:     Effort: Pulmonary effort is normal.  Skin:    General: Skin is warm and dry.  Neurological:     Mental Status: She is alert and oriented to person, place, and time.  Psychiatric:        Mood and Affect: Mood normal.    Vitals:   10/16/22 1457  BP: 101/63  Pulse: 65  Weight: 127 lb (57.6 kg)  Height: 5\' 1"  (1.549 m)      Assessment & Plan:  5 you female with perimenopausal symptoms   Discussed options and pt would like to try OCPs.  Pt does not have unexplained vaginal bleeding and no contraindication to estrogen.  No DM, HTN, breast cancer, DVT. Needs annual exam--will schedule in 6 weeks to eval bleeding an symptoms on OCPs.

## 2022-11-27 ENCOUNTER — Ambulatory Visit: Payer: BC Managed Care – PPO | Admitting: Obstetrics & Gynecology

## 2022-11-30 NOTE — Progress Notes (Unsigned)
error 

## 2022-12-01 ENCOUNTER — Encounter: Payer: BC Managed Care – PPO | Admitting: Obstetrics & Gynecology

## 2022-12-01 ENCOUNTER — Encounter: Payer: Self-pay | Admitting: Obstetrics & Gynecology

## 2022-12-06 NOTE — Progress Notes (Signed)
This encounter was created in error - please disregard.

## 2022-12-18 ENCOUNTER — Ambulatory Visit: Payer: BC Managed Care – PPO | Admitting: Obstetrics & Gynecology

## 2022-12-18 ENCOUNTER — Encounter: Payer: Self-pay | Admitting: Obstetrics & Gynecology

## 2022-12-18 ENCOUNTER — Other Ambulatory Visit (HOSPITAL_COMMUNITY)
Admission: RE | Admit: 2022-12-18 | Discharge: 2022-12-18 | Disposition: A | Discharge status: Home / Self Care | Payer: BC Managed Care – PPO | Source: Ambulatory Visit | Attending: Obstetrics & Gynecology | Admitting: Obstetrics & Gynecology

## 2022-12-18 VITALS — BP 95/61 | HR 69 | Ht 61.0 in | Wt 125.0 lb

## 2022-12-18 DIAGNOSIS — B9689 Other specified bacterial agents as the cause of diseases classified elsewhere: Secondary | ICD-10-CM | POA: Insufficient documentation

## 2022-12-18 DIAGNOSIS — N760 Acute vaginitis: Secondary | ICD-10-CM

## 2022-12-18 DIAGNOSIS — Z01411 Encounter for gynecological examination (general) (routine) with abnormal findings: Secondary | ICD-10-CM | POA: Diagnosis not present

## 2022-12-18 DIAGNOSIS — Z01419 Encounter for gynecological examination (general) (routine) without abnormal findings: Secondary | ICD-10-CM | POA: Insufficient documentation

## 2022-12-18 DIAGNOSIS — N898 Other specified noninflammatory disorders of vagina: Secondary | ICD-10-CM

## 2022-12-18 DIAGNOSIS — R8290 Unspecified abnormal findings in urine: Secondary | ICD-10-CM

## 2022-12-18 NOTE — Progress Notes (Signed)
  Subjective:     Carol Silva is a 52 y.o. female here for a routine exam.  Current complaints: vaginal buning and itching--presumed BV.  Pt also c/o dark and malodorous urine.  Feeling better on OCPs and wants to continue.  Not having an irregular bleeidng.    Gynecologic History Patient's last menstrual period was 12/06/2022. Contraception: OCP (estrogen/progesterone) Last Mammogram: 12/30/21- neg Last Pap Smear:  12/25/19- neg Last Colon Screening;  2018 Seat Belts:   yes Sun Screen:   yes Dental Check Up:  yes Brush & Floss:  yes   Obstetric History OB History  Gravida Para Term Preterm AB Living  2 1 0 1 0 2  SAB IAB Ectopic Multiple Live Births  0 0 0 1 1    # Outcome Date GA Lbr Len/2nd Weight Sex Type Anes PTL Lv  2 Gravida           1 Preterm 07/27/00    M [id number]   LIV     Birth Comments: twins     The following portions of the patient's history were reviewed and updated as appropriate: allergies, current medications, past family history, past medical history, past social history, past surgical history, and problem list.  Review of Systems Pertinent items noted in HPI and remainder of comprehensive ROS otherwise negative.    Objective:     Vitals:   12/18/22 0844  BP: 95/61  Pulse: 69  Weight: 125 lb (56.7 kg)  Height: 5\' 1"  (1.549 m)   Vitals:  WNL General appearance: alert, cooperative and no distress  HEENT: Normocephalic, without obvious abnormality, atraumatic Eyes: negative Throat: lips, mucosa, and tongue normal; teeth and gums normal  Respiratory: Clear to auscultation bilaterally  CV: Regular rate and rhythm  Breasts:  Normal appearance, no masses or tenderness, no nipple retraction or dimpling  GI: Soft, non-tender; bowel sounds normal; no masses,  no organomegaly  GU: External Genitalia:  Tanner V, no lesion Urethra:  No prolapse   Vagina: Pink, normal rugae, no blood or discharge  Cervix: No CMT, no lesion  Uterus:  Normal size and  contour, non tender  Adnexa: Normal, no masses, non tender  Musculoskeletal: No edema, redness or tenderness in the calves or thighs  Skin: No lesions or rash  Lymphatic: Axillary adenopathy: none     Psychiatric: Normal mood and behavior        Assessment:    Healthy female exam.    Plan:    BV Screen for yeast UA with reflex culture for dark malodorous urine.  Pap with HPV testing Yearly mammograms Cont OCPs--feeling better on them.

## 2022-12-19 ENCOUNTER — Encounter: Payer: Self-pay | Admitting: Obstetrics & Gynecology

## 2022-12-19 LAB — CERVICOVAGINAL ANCILLARY ONLY
Bacterial Vaginitis (gardnerella): NEGATIVE
Candida Glabrata: NEGATIVE
Candida Vaginitis: NEGATIVE
Comment: NEGATIVE
Comment: NEGATIVE
Comment: NEGATIVE

## 2022-12-19 LAB — CYTOLOGY - PAP
Adequacy: ABSENT
Comment: NEGATIVE
Diagnosis: NEGATIVE
High risk HPV: NEGATIVE

## 2022-12-19 LAB — URINALYSIS
Bilirubin, UA: NEGATIVE
Glucose, UA: NEGATIVE
Ketones, UA: NEGATIVE
Leukocytes,UA: NEGATIVE
Nitrite, UA: NEGATIVE
Protein,UA: NEGATIVE
RBC, UA: NEGATIVE
Specific Gravity, UA: 1.027 (ref 1.005–1.030)
Urobilinogen, Ur: 0.2 mg/dL (ref 0.2–1.0)
pH, UA: 6 (ref 5.0–7.5)

## 2022-12-20 ENCOUNTER — Encounter: Payer: Self-pay | Admitting: Obstetrics & Gynecology

## 2023-01-12 ENCOUNTER — Ambulatory Visit
Admission: RE | Admit: 2023-01-12 | Discharge: 2023-01-12 | Disposition: A | Discharge status: Home / Self Care | Payer: BC Managed Care – PPO | Source: Ambulatory Visit | Attending: Obstetrics & Gynecology | Admitting: Obstetrics & Gynecology

## 2023-01-12 DIAGNOSIS — Z01419 Encounter for gynecological examination (general) (routine) without abnormal findings: Secondary | ICD-10-CM
